# Patient Record
Sex: Female | Born: 1945 | Race: White | Hispanic: No | Marital: Married | Smoking: Never smoker
Health system: Southern US, Community
[De-identification: ages and names within clinical notes are randomized; demographics above are authoritative.]

## PROBLEM LIST (undated history)

## (undated) DIAGNOSIS — E7800 Pure hypercholesterolemia, unspecified: Secondary | ICD-10-CM

## (undated) DIAGNOSIS — N183 Chronic kidney disease, stage 3 unspecified: Secondary | ICD-10-CM

## (undated) DIAGNOSIS — M810 Age-related osteoporosis without current pathological fracture: Secondary | ICD-10-CM

## (undated) DIAGNOSIS — I10 Essential (primary) hypertension: Secondary | ICD-10-CM

## (undated) HISTORY — PX: ABDOMINAL HYSTERECTOMY: SHX81

## (undated) HISTORY — PX: APPENDECTOMY: SHX54

---

## 1997-12-05 ENCOUNTER — Other Ambulatory Visit: Admission: RE | Admit: 1997-12-05 | Discharge: 1997-12-05 | Payer: Self-pay | Admitting: Obstetrics and Gynecology

## 1999-01-15 ENCOUNTER — Encounter: Admission: RE | Admit: 1999-01-15 | Discharge: 1999-01-15 | Payer: Self-pay | Admitting: Unknown Physician Specialty

## 1999-01-15 ENCOUNTER — Other Ambulatory Visit: Admission: RE | Admit: 1999-01-15 | Discharge: 1999-01-15 | Payer: Self-pay | Admitting: *Deleted

## 1999-01-15 ENCOUNTER — Encounter: Payer: Self-pay | Admitting: Family Medicine

## 1999-01-24 ENCOUNTER — Encounter: Admission: RE | Admit: 1999-01-24 | Discharge: 1999-01-24 | Payer: Self-pay | Admitting: Unknown Physician Specialty

## 1999-01-24 ENCOUNTER — Encounter: Payer: Self-pay | Admitting: Family Medicine

## 2014-08-25 DIAGNOSIS — R921 Mammographic calcification found on diagnostic imaging of breast: Secondary | ICD-10-CM | POA: Diagnosis not present

## 2014-08-25 DIAGNOSIS — R928 Other abnormal and inconclusive findings on diagnostic imaging of breast: Secondary | ICD-10-CM | POA: Diagnosis not present

## 2014-10-10 DIAGNOSIS — H524 Presbyopia: Secondary | ICD-10-CM | POA: Diagnosis not present

## 2014-10-10 DIAGNOSIS — H5210 Myopia, unspecified eye: Secondary | ICD-10-CM | POA: Diagnosis not present

## 2014-10-19 DIAGNOSIS — Z0100 Encounter for examination of eyes and vision without abnormal findings: Secondary | ICD-10-CM | POA: Diagnosis not present

## 2014-11-07 DIAGNOSIS — E785 Hyperlipidemia, unspecified: Secondary | ICD-10-CM | POA: Diagnosis not present

## 2014-11-07 DIAGNOSIS — Z9181 History of falling: Secondary | ICD-10-CM | POA: Diagnosis not present

## 2014-11-07 DIAGNOSIS — N644 Mastodynia: Secondary | ICD-10-CM | POA: Diagnosis not present

## 2014-11-07 DIAGNOSIS — B360 Pityriasis versicolor: Secondary | ICD-10-CM | POA: Diagnosis not present

## 2014-11-07 DIAGNOSIS — Z139 Encounter for screening, unspecified: Secondary | ICD-10-CM | POA: Diagnosis not present

## 2014-11-07 DIAGNOSIS — I10 Essential (primary) hypertension: Secondary | ICD-10-CM | POA: Diagnosis not present

## 2014-11-07 DIAGNOSIS — D539 Nutritional anemia, unspecified: Secondary | ICD-10-CM | POA: Diagnosis not present

## 2014-11-07 DIAGNOSIS — R739 Hyperglycemia, unspecified: Secondary | ICD-10-CM | POA: Diagnosis not present

## 2014-11-07 DIAGNOSIS — Z1389 Encounter for screening for other disorder: Secondary | ICD-10-CM | POA: Diagnosis not present

## 2014-12-19 DIAGNOSIS — Z6828 Body mass index (BMI) 28.0-28.9, adult: Secondary | ICD-10-CM | POA: Diagnosis not present

## 2014-12-19 DIAGNOSIS — I10 Essential (primary) hypertension: Secondary | ICD-10-CM | POA: Diagnosis not present

## 2014-12-19 DIAGNOSIS — N644 Mastodynia: Secondary | ICD-10-CM | POA: Diagnosis not present

## 2014-12-19 DIAGNOSIS — E785 Hyperlipidemia, unspecified: Secondary | ICD-10-CM | POA: Diagnosis not present

## 2014-12-19 DIAGNOSIS — B360 Pityriasis versicolor: Secondary | ICD-10-CM | POA: Diagnosis not present

## 2015-02-03 DIAGNOSIS — R1011 Right upper quadrant pain: Secondary | ICD-10-CM | POA: Diagnosis not present

## 2015-02-03 DIAGNOSIS — I10 Essential (primary) hypertension: Secondary | ICD-10-CM | POA: Diagnosis not present

## 2015-02-03 DIAGNOSIS — Z79899 Other long term (current) drug therapy: Secondary | ICD-10-CM | POA: Diagnosis not present

## 2015-02-03 DIAGNOSIS — N390 Urinary tract infection, site not specified: Secondary | ICD-10-CM | POA: Diagnosis not present

## 2015-03-24 DIAGNOSIS — E785 Hyperlipidemia, unspecified: Secondary | ICD-10-CM | POA: Diagnosis not present

## 2015-03-24 DIAGNOSIS — N390 Urinary tract infection, site not specified: Secondary | ICD-10-CM | POA: Diagnosis not present

## 2015-03-24 DIAGNOSIS — Z6829 Body mass index (BMI) 29.0-29.9, adult: Secondary | ICD-10-CM | POA: Diagnosis not present

## 2015-03-24 DIAGNOSIS — I10 Essential (primary) hypertension: Secondary | ICD-10-CM | POA: Diagnosis not present

## 2015-03-24 DIAGNOSIS — D539 Nutritional anemia, unspecified: Secondary | ICD-10-CM | POA: Diagnosis not present

## 2015-03-24 DIAGNOSIS — R739 Hyperglycemia, unspecified: Secondary | ICD-10-CM | POA: Diagnosis not present

## 2015-05-05 DIAGNOSIS — Z6829 Body mass index (BMI) 29.0-29.9, adult: Secondary | ICD-10-CM | POA: Diagnosis not present

## 2015-05-05 DIAGNOSIS — N390 Urinary tract infection, site not specified: Secondary | ICD-10-CM | POA: Diagnosis not present

## 2015-05-05 DIAGNOSIS — I10 Essential (primary) hypertension: Secondary | ICD-10-CM | POA: Diagnosis not present

## 2015-05-25 DIAGNOSIS — Z6829 Body mass index (BMI) 29.0-29.9, adult: Secondary | ICD-10-CM | POA: Diagnosis not present

## 2015-05-25 DIAGNOSIS — J208 Acute bronchitis due to other specified organisms: Secondary | ICD-10-CM | POA: Diagnosis not present

## 2015-05-25 DIAGNOSIS — J309 Allergic rhinitis, unspecified: Secondary | ICD-10-CM | POA: Diagnosis not present

## 2015-06-30 DIAGNOSIS — D539 Nutritional anemia, unspecified: Secondary | ICD-10-CM | POA: Diagnosis not present

## 2015-06-30 DIAGNOSIS — E785 Hyperlipidemia, unspecified: Secondary | ICD-10-CM | POA: Diagnosis not present

## 2015-06-30 DIAGNOSIS — I10 Essential (primary) hypertension: Secondary | ICD-10-CM | POA: Diagnosis not present

## 2015-06-30 DIAGNOSIS — R739 Hyperglycemia, unspecified: Secondary | ICD-10-CM | POA: Diagnosis not present

## 2015-06-30 DIAGNOSIS — N390 Urinary tract infection, site not specified: Secondary | ICD-10-CM | POA: Diagnosis not present

## 2015-06-30 DIAGNOSIS — Z6829 Body mass index (BMI) 29.0-29.9, adult: Secondary | ICD-10-CM | POA: Diagnosis not present

## 2015-06-30 DIAGNOSIS — R109 Unspecified abdominal pain: Secondary | ICD-10-CM | POA: Diagnosis not present

## 2015-06-30 DIAGNOSIS — E559 Vitamin D deficiency, unspecified: Secondary | ICD-10-CM | POA: Diagnosis not present

## 2015-07-10 DIAGNOSIS — N390 Urinary tract infection, site not specified: Secondary | ICD-10-CM | POA: Diagnosis not present

## 2015-07-28 DIAGNOSIS — Z124 Encounter for screening for malignant neoplasm of cervix: Secondary | ICD-10-CM | POA: Diagnosis not present

## 2015-07-28 DIAGNOSIS — Z0000 Encounter for general adult medical examination without abnormal findings: Secondary | ICD-10-CM | POA: Diagnosis not present

## 2015-07-28 DIAGNOSIS — Z6828 Body mass index (BMI) 28.0-28.9, adult: Secondary | ICD-10-CM | POA: Diagnosis not present

## 2015-07-28 DIAGNOSIS — N390 Urinary tract infection, site not specified: Secondary | ICD-10-CM | POA: Diagnosis not present

## 2015-08-18 DIAGNOSIS — R3129 Other microscopic hematuria: Secondary | ICD-10-CM | POA: Diagnosis not present

## 2015-08-18 DIAGNOSIS — R1031 Right lower quadrant pain: Secondary | ICD-10-CM | POA: Diagnosis not present

## 2015-08-18 DIAGNOSIS — N3090 Cystitis, unspecified without hematuria: Secondary | ICD-10-CM | POA: Diagnosis not present

## 2015-08-31 DIAGNOSIS — L559 Sunburn, unspecified: Secondary | ICD-10-CM | POA: Diagnosis not present

## 2015-08-31 DIAGNOSIS — S90821A Blister (nonthermal), right foot, initial encounter: Secondary | ICD-10-CM | POA: Diagnosis not present

## 2015-08-31 DIAGNOSIS — L03115 Cellulitis of right lower limb: Secondary | ICD-10-CM | POA: Diagnosis not present

## 2015-09-01 DIAGNOSIS — R3129 Other microscopic hematuria: Secondary | ICD-10-CM | POA: Diagnosis not present

## 2015-09-01 DIAGNOSIS — N3090 Cystitis, unspecified without hematuria: Secondary | ICD-10-CM | POA: Diagnosis not present

## 2015-09-06 DIAGNOSIS — Z6829 Body mass index (BMI) 29.0-29.9, adult: Secondary | ICD-10-CM | POA: Diagnosis not present

## 2015-09-06 DIAGNOSIS — S91301A Unspecified open wound, right foot, initial encounter: Secondary | ICD-10-CM | POA: Diagnosis not present

## 2015-09-06 DIAGNOSIS — L03115 Cellulitis of right lower limb: Secondary | ICD-10-CM | POA: Diagnosis not present

## 2015-09-18 DIAGNOSIS — R1013 Epigastric pain: Secondary | ICD-10-CM | POA: Diagnosis not present

## 2015-09-18 DIAGNOSIS — R1011 Right upper quadrant pain: Secondary | ICD-10-CM | POA: Diagnosis not present

## 2015-09-18 DIAGNOSIS — Z6829 Body mass index (BMI) 29.0-29.9, adult: Secondary | ICD-10-CM | POA: Diagnosis not present

## 2015-09-25 DIAGNOSIS — R1011 Right upper quadrant pain: Secondary | ICD-10-CM | POA: Diagnosis not present

## 2015-09-25 DIAGNOSIS — R109 Unspecified abdominal pain: Secondary | ICD-10-CM | POA: Diagnosis not present

## 2015-09-25 DIAGNOSIS — K449 Diaphragmatic hernia without obstruction or gangrene: Secondary | ICD-10-CM | POA: Diagnosis not present

## 2015-09-28 DIAGNOSIS — R1011 Right upper quadrant pain: Secondary | ICD-10-CM | POA: Diagnosis not present

## 2015-09-28 DIAGNOSIS — R1013 Epigastric pain: Secondary | ICD-10-CM | POA: Diagnosis not present

## 2015-10-02 DIAGNOSIS — R0789 Other chest pain: Secondary | ICD-10-CM | POA: Diagnosis not present

## 2015-11-14 DIAGNOSIS — R21 Rash and other nonspecific skin eruption: Secondary | ICD-10-CM | POA: Diagnosis not present

## 2015-11-14 DIAGNOSIS — K219 Gastro-esophageal reflux disease without esophagitis: Secondary | ICD-10-CM | POA: Diagnosis not present

## 2015-11-14 DIAGNOSIS — Z9181 History of falling: Secondary | ICD-10-CM | POA: Diagnosis not present

## 2015-11-14 DIAGNOSIS — E785 Hyperlipidemia, unspecified: Secondary | ICD-10-CM | POA: Diagnosis not present

## 2015-11-14 DIAGNOSIS — R739 Hyperglycemia, unspecified: Secondary | ICD-10-CM | POA: Diagnosis not present

## 2015-11-14 DIAGNOSIS — Z1389 Encounter for screening for other disorder: Secondary | ICD-10-CM | POA: Diagnosis not present

## 2015-11-14 DIAGNOSIS — D539 Nutritional anemia, unspecified: Secondary | ICD-10-CM | POA: Diagnosis not present

## 2015-11-14 DIAGNOSIS — R300 Dysuria: Secondary | ICD-10-CM | POA: Diagnosis not present

## 2015-11-14 DIAGNOSIS — N644 Mastodynia: Secondary | ICD-10-CM | POA: Diagnosis not present

## 2015-11-14 DIAGNOSIS — N390 Urinary tract infection, site not specified: Secondary | ICD-10-CM | POA: Diagnosis not present

## 2015-11-23 DIAGNOSIS — L0291 Cutaneous abscess, unspecified: Secondary | ICD-10-CM | POA: Diagnosis not present

## 2015-12-20 DIAGNOSIS — L570 Actinic keratosis: Secondary | ICD-10-CM | POA: Diagnosis not present

## 2015-12-20 DIAGNOSIS — B351 Tinea unguium: Secondary | ICD-10-CM | POA: Diagnosis not present

## 2015-12-20 DIAGNOSIS — L300 Nummular dermatitis: Secondary | ICD-10-CM | POA: Diagnosis not present

## 2015-12-20 DIAGNOSIS — R531 Weakness: Secondary | ICD-10-CM | POA: Diagnosis not present

## 2015-12-25 DIAGNOSIS — Z1231 Encounter for screening mammogram for malignant neoplasm of breast: Secondary | ICD-10-CM | POA: Diagnosis not present

## 2015-12-26 DIAGNOSIS — M8589 Other specified disorders of bone density and structure, multiple sites: Secondary | ICD-10-CM | POA: Diagnosis not present

## 2015-12-26 DIAGNOSIS — M810 Age-related osteoporosis without current pathological fracture: Secondary | ICD-10-CM | POA: Diagnosis not present

## 2016-02-22 DIAGNOSIS — E559 Vitamin D deficiency, unspecified: Secondary | ICD-10-CM | POA: Diagnosis not present

## 2016-02-22 DIAGNOSIS — M810 Age-related osteoporosis without current pathological fracture: Secondary | ICD-10-CM | POA: Diagnosis not present

## 2016-02-22 DIAGNOSIS — E785 Hyperlipidemia, unspecified: Secondary | ICD-10-CM | POA: Diagnosis not present

## 2016-02-22 DIAGNOSIS — D539 Nutritional anemia, unspecified: Secondary | ICD-10-CM | POA: Diagnosis not present

## 2016-02-22 DIAGNOSIS — R739 Hyperglycemia, unspecified: Secondary | ICD-10-CM | POA: Diagnosis not present

## 2016-02-22 DIAGNOSIS — Z2821 Immunization not carried out because of patient refusal: Secondary | ICD-10-CM | POA: Diagnosis not present

## 2016-02-22 DIAGNOSIS — M545 Low back pain: Secondary | ICD-10-CM | POA: Diagnosis not present

## 2016-02-22 DIAGNOSIS — I10 Essential (primary) hypertension: Secondary | ICD-10-CM | POA: Diagnosis not present

## 2016-03-30 DIAGNOSIS — N3000 Acute cystitis without hematuria: Secondary | ICD-10-CM | POA: Diagnosis not present

## 2016-03-30 DIAGNOSIS — R300 Dysuria: Secondary | ICD-10-CM | POA: Diagnosis not present

## 2016-03-30 DIAGNOSIS — B009 Herpesviral infection, unspecified: Secondary | ICD-10-CM | POA: Diagnosis not present

## 2016-03-30 DIAGNOSIS — J019 Acute sinusitis, unspecified: Secondary | ICD-10-CM | POA: Diagnosis not present

## 2016-04-10 DIAGNOSIS — N390 Urinary tract infection, site not specified: Secondary | ICD-10-CM | POA: Diagnosis not present

## 2016-04-10 DIAGNOSIS — R5381 Other malaise: Secondary | ICD-10-CM | POA: Diagnosis not present

## 2016-05-30 DIAGNOSIS — K219 Gastro-esophageal reflux disease without esophagitis: Secondary | ICD-10-CM | POA: Diagnosis not present

## 2016-05-30 DIAGNOSIS — D539 Nutritional anemia, unspecified: Secondary | ICD-10-CM | POA: Diagnosis not present

## 2016-05-30 DIAGNOSIS — R739 Hyperglycemia, unspecified: Secondary | ICD-10-CM | POA: Diagnosis not present

## 2016-05-30 DIAGNOSIS — I10 Essential (primary) hypertension: Secondary | ICD-10-CM | POA: Diagnosis not present

## 2016-05-30 DIAGNOSIS — E785 Hyperlipidemia, unspecified: Secondary | ICD-10-CM | POA: Diagnosis not present

## 2016-09-12 DIAGNOSIS — Z0000 Encounter for general adult medical examination without abnormal findings: Secondary | ICD-10-CM | POA: Diagnosis not present

## 2016-09-12 DIAGNOSIS — E785 Hyperlipidemia, unspecified: Secondary | ICD-10-CM | POA: Diagnosis not present

## 2016-09-12 DIAGNOSIS — Z1389 Encounter for screening for other disorder: Secondary | ICD-10-CM | POA: Diagnosis not present

## 2016-09-12 DIAGNOSIS — Z139 Encounter for screening, unspecified: Secondary | ICD-10-CM | POA: Diagnosis not present

## 2016-09-12 DIAGNOSIS — Z1231 Encounter for screening mammogram for malignant neoplasm of breast: Secondary | ICD-10-CM | POA: Diagnosis not present

## 2016-09-12 DIAGNOSIS — Z1211 Encounter for screening for malignant neoplasm of colon: Secondary | ICD-10-CM | POA: Diagnosis not present

## 2016-09-12 DIAGNOSIS — Z9181 History of falling: Secondary | ICD-10-CM | POA: Diagnosis not present

## 2016-09-12 DIAGNOSIS — Z136 Encounter for screening for cardiovascular disorders: Secondary | ICD-10-CM | POA: Diagnosis not present

## 2016-10-01 DIAGNOSIS — K219 Gastro-esophageal reflux disease without esophagitis: Secondary | ICD-10-CM | POA: Diagnosis not present

## 2016-10-01 DIAGNOSIS — R739 Hyperglycemia, unspecified: Secondary | ICD-10-CM | POA: Diagnosis not present

## 2016-10-01 DIAGNOSIS — M810 Age-related osteoporosis without current pathological fracture: Secondary | ICD-10-CM | POA: Diagnosis not present

## 2016-10-01 DIAGNOSIS — I10 Essential (primary) hypertension: Secondary | ICD-10-CM | POA: Diagnosis not present

## 2016-10-01 DIAGNOSIS — D539 Nutritional anemia, unspecified: Secondary | ICD-10-CM | POA: Diagnosis not present

## 2016-10-01 DIAGNOSIS — E785 Hyperlipidemia, unspecified: Secondary | ICD-10-CM | POA: Diagnosis not present

## 2016-10-07 DIAGNOSIS — Z01818 Encounter for other preprocedural examination: Secondary | ICD-10-CM | POA: Diagnosis not present

## 2016-10-07 DIAGNOSIS — Z86010 Personal history of colonic polyps: Secondary | ICD-10-CM | POA: Diagnosis not present

## 2016-10-07 DIAGNOSIS — Z800 Family history of malignant neoplasm of digestive organs: Secondary | ICD-10-CM | POA: Diagnosis not present

## 2016-10-21 DIAGNOSIS — N390 Urinary tract infection, site not specified: Secondary | ICD-10-CM | POA: Diagnosis not present

## 2016-10-24 DIAGNOSIS — Z79899 Other long term (current) drug therapy: Secondary | ICD-10-CM | POA: Diagnosis not present

## 2016-10-24 DIAGNOSIS — Z90710 Acquired absence of both cervix and uterus: Secondary | ICD-10-CM | POA: Diagnosis not present

## 2016-10-24 DIAGNOSIS — Z85828 Personal history of other malignant neoplasm of skin: Secondary | ICD-10-CM | POA: Diagnosis not present

## 2016-10-24 DIAGNOSIS — Z86010 Personal history of colonic polyps: Secondary | ICD-10-CM | POA: Diagnosis not present

## 2016-10-24 DIAGNOSIS — Z1211 Encounter for screening for malignant neoplasm of colon: Secondary | ICD-10-CM | POA: Diagnosis not present

## 2016-10-24 DIAGNOSIS — K5730 Diverticulosis of large intestine without perforation or abscess without bleeding: Secondary | ICD-10-CM | POA: Diagnosis not present

## 2016-10-24 DIAGNOSIS — I10 Essential (primary) hypertension: Secondary | ICD-10-CM | POA: Diagnosis not present

## 2016-10-24 DIAGNOSIS — K644 Residual hemorrhoidal skin tags: Secondary | ICD-10-CM | POA: Diagnosis not present

## 2016-10-24 DIAGNOSIS — Z800 Family history of malignant neoplasm of digestive organs: Secondary | ICD-10-CM | POA: Diagnosis not present

## 2016-11-06 DIAGNOSIS — Z6829 Body mass index (BMI) 29.0-29.9, adult: Secondary | ICD-10-CM | POA: Diagnosis not present

## 2016-11-06 DIAGNOSIS — N390 Urinary tract infection, site not specified: Secondary | ICD-10-CM | POA: Diagnosis not present

## 2016-11-06 DIAGNOSIS — M25561 Pain in right knee: Secondary | ICD-10-CM | POA: Diagnosis not present

## 2016-11-14 DIAGNOSIS — N390 Urinary tract infection, site not specified: Secondary | ICD-10-CM | POA: Diagnosis not present

## 2016-11-15 DIAGNOSIS — M1711 Unilateral primary osteoarthritis, right knee: Secondary | ICD-10-CM | POA: Diagnosis not present

## 2016-12-10 DIAGNOSIS — H52223 Regular astigmatism, bilateral: Secondary | ICD-10-CM | POA: Diagnosis not present

## 2016-12-24 DIAGNOSIS — H02839 Dermatochalasis of unspecified eye, unspecified eyelid: Secondary | ICD-10-CM | POA: Diagnosis not present

## 2016-12-24 DIAGNOSIS — H26491 Other secondary cataract, right eye: Secondary | ICD-10-CM | POA: Diagnosis not present

## 2016-12-24 DIAGNOSIS — I10 Essential (primary) hypertension: Secondary | ICD-10-CM | POA: Diagnosis not present

## 2016-12-24 DIAGNOSIS — Z961 Presence of intraocular lens: Secondary | ICD-10-CM | POA: Diagnosis not present

## 2017-02-07 DIAGNOSIS — R300 Dysuria: Secondary | ICD-10-CM | POA: Diagnosis not present

## 2017-02-07 DIAGNOSIS — I10 Essential (primary) hypertension: Secondary | ICD-10-CM | POA: Diagnosis not present

## 2017-02-07 DIAGNOSIS — Z1231 Encounter for screening mammogram for malignant neoplasm of breast: Secondary | ICD-10-CM | POA: Diagnosis not present

## 2017-02-07 DIAGNOSIS — Z1331 Encounter for screening for depression: Secondary | ICD-10-CM | POA: Diagnosis not present

## 2017-02-07 DIAGNOSIS — D539 Nutritional anemia, unspecified: Secondary | ICD-10-CM | POA: Diagnosis not present

## 2017-02-07 DIAGNOSIS — E785 Hyperlipidemia, unspecified: Secondary | ICD-10-CM | POA: Diagnosis not present

## 2017-02-07 DIAGNOSIS — J309 Allergic rhinitis, unspecified: Secondary | ICD-10-CM | POA: Diagnosis not present

## 2017-02-07 DIAGNOSIS — R252 Cramp and spasm: Secondary | ICD-10-CM | POA: Diagnosis not present

## 2017-07-27 DIAGNOSIS — S8392XA Sprain of unspecified site of left knee, initial encounter: Secondary | ICD-10-CM | POA: Diagnosis not present

## 2017-10-23 DIAGNOSIS — Z6828 Body mass index (BMI) 28.0-28.9, adult: Secondary | ICD-10-CM | POA: Diagnosis not present

## 2017-10-23 DIAGNOSIS — N390 Urinary tract infection, site not specified: Secondary | ICD-10-CM | POA: Diagnosis not present

## 2017-10-23 DIAGNOSIS — R350 Frequency of micturition: Secondary | ICD-10-CM | POA: Diagnosis not present

## 2017-10-23 DIAGNOSIS — M25562 Pain in left knee: Secondary | ICD-10-CM | POA: Diagnosis not present

## 2017-10-23 DIAGNOSIS — R21 Rash and other nonspecific skin eruption: Secondary | ICD-10-CM | POA: Diagnosis not present

## 2017-10-30 DIAGNOSIS — M1712 Unilateral primary osteoarthritis, left knee: Secondary | ICD-10-CM | POA: Diagnosis not present

## 2018-01-01 DIAGNOSIS — E785 Hyperlipidemia, unspecified: Secondary | ICD-10-CM | POA: Diagnosis not present

## 2018-01-01 DIAGNOSIS — Z9181 History of falling: Secondary | ICD-10-CM | POA: Diagnosis not present

## 2018-01-01 DIAGNOSIS — Z139 Encounter for screening, unspecified: Secondary | ICD-10-CM | POA: Diagnosis not present

## 2018-01-01 DIAGNOSIS — N390 Urinary tract infection, site not specified: Secondary | ICD-10-CM | POA: Diagnosis not present

## 2018-01-01 DIAGNOSIS — Z1339 Encounter for screening examination for other mental health and behavioral disorders: Secondary | ICD-10-CM | POA: Diagnosis not present

## 2018-01-01 DIAGNOSIS — R350 Frequency of micturition: Secondary | ICD-10-CM | POA: Diagnosis not present

## 2018-01-01 DIAGNOSIS — R5381 Other malaise: Secondary | ICD-10-CM | POA: Diagnosis not present

## 2018-01-01 DIAGNOSIS — J069 Acute upper respiratory infection, unspecified: Secondary | ICD-10-CM | POA: Diagnosis not present

## 2018-01-01 DIAGNOSIS — N644 Mastodynia: Secondary | ICD-10-CM | POA: Diagnosis not present

## 2018-01-01 DIAGNOSIS — E663 Overweight: Secondary | ICD-10-CM | POA: Diagnosis not present

## 2018-01-20 DIAGNOSIS — D631 Anemia in chronic kidney disease: Secondary | ICD-10-CM | POA: Diagnosis not present

## 2018-01-20 DIAGNOSIS — N189 Chronic kidney disease, unspecified: Secondary | ICD-10-CM | POA: Diagnosis not present

## 2018-01-20 DIAGNOSIS — R946 Abnormal results of thyroid function studies: Secondary | ICD-10-CM

## 2018-01-20 DIAGNOSIS — N289 Disorder of kidney and ureter, unspecified: Secondary | ICD-10-CM

## 2018-01-20 DIAGNOSIS — D6489 Other specified anemias: Secondary | ICD-10-CM

## 2018-01-27 DIAGNOSIS — R946 Abnormal results of thyroid function studies: Secondary | ICD-10-CM | POA: Diagnosis not present

## 2018-01-27 DIAGNOSIS — D631 Anemia in chronic kidney disease: Secondary | ICD-10-CM | POA: Diagnosis not present

## 2018-01-27 DIAGNOSIS — N189 Chronic kidney disease, unspecified: Secondary | ICD-10-CM | POA: Diagnosis not present

## 2018-01-28 DIAGNOSIS — Z1231 Encounter for screening mammogram for malignant neoplasm of breast: Secondary | ICD-10-CM | POA: Diagnosis not present

## 2018-02-02 DIAGNOSIS — H52223 Regular astigmatism, bilateral: Secondary | ICD-10-CM | POA: Diagnosis not present

## 2018-02-02 DIAGNOSIS — H524 Presbyopia: Secondary | ICD-10-CM | POA: Diagnosis not present

## 2018-02-12 DIAGNOSIS — N289 Disorder of kidney and ureter, unspecified: Secondary | ICD-10-CM | POA: Diagnosis not present

## 2018-02-12 DIAGNOSIS — D539 Nutritional anemia, unspecified: Secondary | ICD-10-CM | POA: Diagnosis not present

## 2018-02-12 DIAGNOSIS — R5381 Other malaise: Secondary | ICD-10-CM | POA: Diagnosis not present

## 2018-02-12 DIAGNOSIS — Z6827 Body mass index (BMI) 27.0-27.9, adult: Secondary | ICD-10-CM | POA: Diagnosis not present

## 2018-02-12 DIAGNOSIS — M545 Low back pain: Secondary | ICD-10-CM | POA: Diagnosis not present

## 2018-03-24 DIAGNOSIS — N183 Chronic kidney disease, stage 3 (moderate): Secondary | ICD-10-CM | POA: Diagnosis not present

## 2018-03-24 DIAGNOSIS — D631 Anemia in chronic kidney disease: Secondary | ICD-10-CM | POA: Diagnosis not present

## 2018-03-24 DIAGNOSIS — I129 Hypertensive chronic kidney disease with stage 1 through stage 4 chronic kidney disease, or unspecified chronic kidney disease: Secondary | ICD-10-CM | POA: Diagnosis not present

## 2018-03-24 DIAGNOSIS — N390 Urinary tract infection, site not specified: Secondary | ICD-10-CM | POA: Diagnosis not present

## 2018-04-02 ENCOUNTER — Other Ambulatory Visit: Payer: Self-pay | Admitting: Nephrology

## 2018-04-02 DIAGNOSIS — N183 Chronic kidney disease, stage 3 unspecified: Secondary | ICD-10-CM

## 2018-04-06 DIAGNOSIS — D631 Anemia in chronic kidney disease: Secondary | ICD-10-CM | POA: Diagnosis not present

## 2018-04-06 DIAGNOSIS — N189 Chronic kidney disease, unspecified: Secondary | ICD-10-CM | POA: Diagnosis not present

## 2018-04-07 ENCOUNTER — Ambulatory Visit
Admission: RE | Admit: 2018-04-07 | Discharge: 2018-04-07 | Disposition: A | Discharge status: Home / Self Care | Payer: Medicare HMO | Source: Ambulatory Visit | Attending: Nephrology | Admitting: Nephrology

## 2018-04-07 DIAGNOSIS — N183 Chronic kidney disease, stage 3 unspecified: Secondary | ICD-10-CM

## 2018-04-09 DIAGNOSIS — N390 Urinary tract infection, site not specified: Secondary | ICD-10-CM | POA: Diagnosis not present

## 2018-04-13 DIAGNOSIS — D631 Anemia in chronic kidney disease: Secondary | ICD-10-CM | POA: Diagnosis not present

## 2018-04-13 DIAGNOSIS — N189 Chronic kidney disease, unspecified: Secondary | ICD-10-CM | POA: Diagnosis not present

## 2018-05-22 DIAGNOSIS — N390 Urinary tract infection, site not specified: Secondary | ICD-10-CM | POA: Diagnosis not present

## 2018-05-22 DIAGNOSIS — M542 Cervicalgia: Secondary | ICD-10-CM | POA: Diagnosis not present

## 2018-05-22 DIAGNOSIS — Z6827 Body mass index (BMI) 27.0-27.9, adult: Secondary | ICD-10-CM | POA: Diagnosis not present

## 2018-05-22 DIAGNOSIS — N289 Disorder of kidney and ureter, unspecified: Secondary | ICD-10-CM | POA: Diagnosis not present

## 2018-05-22 DIAGNOSIS — R350 Frequency of micturition: Secondary | ICD-10-CM | POA: Diagnosis not present

## 2018-05-22 DIAGNOSIS — D539 Nutritional anemia, unspecified: Secondary | ICD-10-CM | POA: Diagnosis not present

## 2018-05-22 DIAGNOSIS — G4700 Insomnia, unspecified: Secondary | ICD-10-CM | POA: Diagnosis not present

## 2018-05-28 DIAGNOSIS — N183 Chronic kidney disease, stage 3 (moderate): Secondary | ICD-10-CM | POA: Diagnosis not present

## 2018-05-28 DIAGNOSIS — N390 Urinary tract infection, site not specified: Secondary | ICD-10-CM | POA: Diagnosis not present

## 2018-05-28 DIAGNOSIS — I129 Hypertensive chronic kidney disease with stage 1 through stage 4 chronic kidney disease, or unspecified chronic kidney disease: Secondary | ICD-10-CM | POA: Diagnosis not present

## 2018-05-28 DIAGNOSIS — D631 Anemia in chronic kidney disease: Secondary | ICD-10-CM | POA: Diagnosis not present

## 2018-06-15 DIAGNOSIS — Z6827 Body mass index (BMI) 27.0-27.9, adult: Secondary | ICD-10-CM | POA: Diagnosis not present

## 2018-06-15 DIAGNOSIS — Z1331 Encounter for screening for depression: Secondary | ICD-10-CM | POA: Diagnosis not present

## 2018-06-15 DIAGNOSIS — B029 Zoster without complications: Secondary | ICD-10-CM | POA: Diagnosis not present

## 2018-06-26 DIAGNOSIS — S62307A Unspecified fracture of fifth metacarpal bone, left hand, initial encounter for closed fracture: Secondary | ICD-10-CM | POA: Diagnosis not present

## 2018-07-24 DIAGNOSIS — S62307A Unspecified fracture of fifth metacarpal bone, left hand, initial encounter for closed fracture: Secondary | ICD-10-CM | POA: Diagnosis not present

## 2018-09-25 DIAGNOSIS — L0390 Cellulitis, unspecified: Secondary | ICD-10-CM | POA: Diagnosis not present

## 2018-10-09 DIAGNOSIS — E663 Overweight: Secondary | ICD-10-CM | POA: Diagnosis not present

## 2018-10-09 DIAGNOSIS — R21 Rash and other nonspecific skin eruption: Secondary | ICD-10-CM | POA: Diagnosis not present

## 2018-10-09 DIAGNOSIS — R350 Frequency of micturition: Secondary | ICD-10-CM | POA: Diagnosis not present

## 2018-10-09 DIAGNOSIS — I10 Essential (primary) hypertension: Secondary | ICD-10-CM | POA: Diagnosis not present

## 2018-10-09 DIAGNOSIS — E785 Hyperlipidemia, unspecified: Secondary | ICD-10-CM | POA: Diagnosis not present

## 2018-10-09 DIAGNOSIS — D539 Nutritional anemia, unspecified: Secondary | ICD-10-CM | POA: Diagnosis not present

## 2018-10-09 DIAGNOSIS — Z6828 Body mass index (BMI) 28.0-28.9, adult: Secondary | ICD-10-CM | POA: Diagnosis not present

## 2018-10-10 ENCOUNTER — Other Ambulatory Visit: Payer: Self-pay

## 2018-10-10 ENCOUNTER — Emergency Department (HOSPITAL_BASED_OUTPATIENT_CLINIC_OR_DEPARTMENT_OTHER)
Admission: EM | Admit: 2018-10-10 | Discharge: 2018-10-10 | Disposition: A | Discharge status: Home / Self Care | Payer: Medicare HMO | Attending: Emergency Medicine | Admitting: Emergency Medicine

## 2018-10-10 ENCOUNTER — Encounter (HOSPITAL_BASED_OUTPATIENT_CLINIC_OR_DEPARTMENT_OTHER): Payer: Self-pay | Admitting: Emergency Medicine

## 2018-10-10 DIAGNOSIS — Z79899 Other long term (current) drug therapy: Secondary | ICD-10-CM | POA: Diagnosis not present

## 2018-10-10 DIAGNOSIS — N184 Chronic kidney disease, stage 4 (severe): Secondary | ICD-10-CM | POA: Insufficient documentation

## 2018-10-10 DIAGNOSIS — N189 Chronic kidney disease, unspecified: Secondary | ICD-10-CM

## 2018-10-10 DIAGNOSIS — E875 Hyperkalemia: Secondary | ICD-10-CM

## 2018-10-10 DIAGNOSIS — E782 Mixed hyperlipidemia: Secondary | ICD-10-CM | POA: Insufficient documentation

## 2018-10-10 DIAGNOSIS — I129 Hypertensive chronic kidney disease with stage 1 through stage 4 chronic kidney disease, or unspecified chronic kidney disease: Secondary | ICD-10-CM | POA: Insufficient documentation

## 2018-10-10 HISTORY — DX: Pure hypercholesterolemia, unspecified: E78.00

## 2018-10-10 HISTORY — DX: Essential (primary) hypertension: I10

## 2018-10-10 HISTORY — DX: Age-related osteoporosis without current pathological fracture: M81.0

## 2018-10-10 LAB — CBC
HCT: 30.4 % — ABNORMAL LOW (ref 36.0–46.0)
Hemoglobin: 9.7 g/dL — ABNORMAL LOW (ref 12.0–15.0)
MCH: 31.1 pg (ref 26.0–34.0)
MCHC: 31.9 g/dL (ref 30.0–36.0)
MCV: 97.4 fL (ref 80.0–100.0)
Platelets: 301 10*3/uL (ref 150–400)
RBC: 3.12 MIL/uL — ABNORMAL LOW (ref 3.87–5.11)
RDW: 11.2 % — ABNORMAL LOW (ref 11.5–15.5)
WBC: 6.3 10*3/uL (ref 4.0–10.5)
nRBC: 0 % (ref 0.0–0.2)

## 2018-10-10 LAB — BASIC METABOLIC PANEL
Anion gap: 7 (ref 5–15)
BUN: 33 mg/dL — ABNORMAL HIGH (ref 8–23)
CO2: 18 mmol/L — ABNORMAL LOW (ref 22–32)
Calcium: 9.3 mg/dL (ref 8.9–10.3)
Chloride: 111 mmol/L (ref 98–111)
Creatinine, Ser: 2.13 mg/dL — ABNORMAL HIGH (ref 0.44–1.00)
GFR calc Af Amer: 26 mL/min — ABNORMAL LOW (ref >60)
GFR calc non Af Amer: 22 mL/min — ABNORMAL LOW (ref >60)
Glucose, Bld: 101 mg/dL — ABNORMAL HIGH (ref 70–99)
Potassium: 6.1 mmol/L — ABNORMAL HIGH (ref 3.5–5.1)
Sodium: 136 mmol/L (ref 135–145)

## 2018-10-10 MED ORDER — LOKELMA 10 G PO PACK: 10.0000 g | PACK | Freq: Every day | ORAL | 0 refills | Status: AC

## 2018-10-10 MED ORDER — SODIUM CHLORIDE 0.9 % IV SOLN: 1000.0000 mL | INTRAVENOUS | Status: DC

## 2018-10-10 MED ORDER — SODIUM CHLORIDE 0.9% FLUSH
3.0000 mL | Freq: Once | INTRAVENOUS | Status: DC
  Filled 2018-10-10: qty 3

## 2018-10-10 MED ORDER — SODIUM CHLORIDE 0.9 % IV BOLUS (SEPSIS)
500.0000 mL | Freq: Once | INTRAVENOUS | Status: AC
  Administered 2018-10-10: 14:00:00 500 mL via INTRAVENOUS

## 2018-10-10 NOTE — Discharge Instructions (Signed)
Stop taking the lisinopril hydrochlorothiazide medication.  Start taking the [facility] to help with your potassium level over the next week.  Contact your kidney doctors office on Monday to arrange follow-up laboratory testing and an appointment.

## 2018-10-10 NOTE — ED Triage Notes (Signed)
Patient states that she has had a rash and fatigue for the last week - she went to her PMD yesterday and was told to come back here because her potassium was elevated

## 2018-10-10 NOTE — ED Provider Notes (Signed)
[facility] EMERGENCY DEPARTMENT Provider Note   CSN: [id number] Arrival date & time: 10/10/18  1214    History   Chief Complaint Chief Complaint  Patient presents with  . Abnormal Lab    elevated K    HPI Samantha Owen is a 73 y.o. female.     HPI Pt has a history of chronic kidney disease.   Back in May she was anemic and had to have a blood transfusion.  She was also told that her epo levels are low.  Patient has not been able to follow-up for any lab tests since that time.  She saw her doctor this past week.  She had lab tests drawn and was told that her kidney tests were abnormal and she needed to come to the ED for IV fluids and further testing..  Patient denies any trouble with vomiting or diarrhea.  She has felt somewhat fatigued recently.  She is also had a rash that she has been itching.  She was out in the heat recently mowing the lawn and she thinks it is possible she could have become dehydrated from that. Past Medical History:  Diagnosis Date  . Hypercholesteremia   . Hypertension   . Osteoporosis     There are no active problems to display for this patient.   Past Surgical History:  Procedure Laterality Date  . ABDOMINAL HYSTERECTOMY    . APPENDECTOMY       OB History   No obstetric history on file.      Home Medications    Prior to Admission medications   Medication Sig Start Date End Date Taking? Authorizing Provider  alendronate (FOSAMAX) 70 MG tablet Take 70 mg by mouth once a week. Take with a full glass of water on an empty stomach.   Yes [provider]  atorvastatin (LIPITOR) 20 MG tablet Take 20 mg by mouth daily.   Yes [provider]  gabapentin (NEURONTIN) 100 MG capsule Take 100 mg by mouth 3 (three) times daily.   Yes [provider]  omeprazole (PRILOSEC) 20 MG capsule Take 20 mg by mouth daily.   Yes [provider]  lisinopril-hydrochlorothiazide (ZESTORETIC) 10-12.5 MG tablet Take 1  tablet by mouth daily.  10/10/18 Yes [provider]  sodium zirconium cyclosilicate (LOKELMA) 10 g PACK packet Take 10 g by mouth daily for 7 days. 10/10/18 10/17/18  Dorie Rank, MD    Family History History reviewed. No pertinent family history.  Social History Social History   Tobacco Use  . Smoking status: Never Smoker  . Smokeless tobacco: Never Used  Substance Use Topics  . Alcohol use: Never    Frequency: Never  . Drug use: Never     Allergies   Patient has no known allergies.   Review of Systems Review of Systems  Constitutional: Negative for fever.  Skin:       Skin itching  All other systems reviewed and are negative.    Physical Exam Updated Vital Signs BP (!) 144/56   Pulse (!) 55   Temp 98.5 F (36.9 C) (Oral)   Resp 14   Ht 1.575 m (5\' 2" )   Wt 68.5 kg   SpO2 100%   BMI 27.62 kg/m   Physical Exam Vitals signs and nursing note reviewed.  Constitutional:      General: She is not in acute distress.    Appearance: She is well-developed.  HENT:     Head: Normocephalic and atraumatic.  Right Ear: External ear normal.     Left Ear: External ear normal.  Eyes:     General: No scleral icterus.       Right eye: No discharge.        Left eye: No discharge.     Conjunctiva/sclera: Conjunctivae normal.  Neck:     Musculoskeletal: Neck supple.     Trachea: No tracheal deviation.  Cardiovascular:     Rate and Rhythm: Normal rate and regular rhythm.  Pulmonary:     Effort: Pulmonary effort is normal. No respiratory distress.     Breath sounds: Normal breath sounds. No stridor. No wheezing or rales.  Abdominal:     General: Bowel sounds are normal. There is no distension.     Palpations: Abdomen is soft.     Tenderness: There is no abdominal tenderness. There is no guarding or rebound.  Musculoskeletal:        General: No tenderness.  Skin:    General: Skin is warm and dry.     Comments: Few small areas of actinic keratoses and areas  of excoriation of the skin, no vesicular pustular lesions,   Neurological:     Mental Status: She is alert.     Cranial Nerves: No cranial nerve deficit (no facial droop, extraocular movements intact, no slurred speech).     Sensory: No sensory deficit.     Motor: No abnormal muscle tone or seizure activity.     Coordination: Coordination normal.      ED Treatments / Results  Labs (all labs ordered are listed, but only abnormal results are displayed) Labs Reviewed  BASIC METABOLIC PANEL - Abnormal; Notable for the following components:      Result Value   Potassium 6.1 (*)    CO2 18 (*)    Glucose, Bld 101 (*)    BUN 33 (*)    Creatinine, Ser 2.13 (*)    GFR calc non Af Amer 22 (*)    GFR calc Af Amer 26 (*)    All other components within normal limits  CBC - Abnormal; Notable for the following components:   RBC 3.12 (*)    Hemoglobin 9.7 (*)    HCT 30.4 (*)    RDW 11.2 (*)    All other components within normal limits    EKG EKG Interpretation  Date/Time:  Saturday October 10 2018 12:55:19 EDT Ventricular Rate:  52 PR Interval:    QRS Duration: 111 QT Interval:  431 QTC Calculation: 401 R Axis:   22 Text Interpretation:  Sinus rhythm No old tracing to compare Confirmed by Dorie Rank [phone]) on 10/10/2018 12:56:48 PM   Radiology No results found.  Procedures Procedures (including critical care time)  Medications Ordered in ED Medications  sodium chloride 0.9 % bolus 500 mL (500 mLs Intravenous New Bag/Given 10/10/18 1404)    Followed by  0.9 %  sodium chloride infusion (has no administration in time range)     Initial Impression / Assessment and Plan / ED Course  I have reviewed the triage vital signs and the nursing notes.  Pertinent labs & imaging results that were available during my care of the patient were reviewed by me and considered in my medical decision making (see chart for details).   Patient's laboratory tests are notable for decreased bicarb,  elevated potassium and increased BUN and creatinine.  Patient does have a history of chronic kidney disease.  According the medical record she saw Dr. Moshe Cipro back  in April of this year.  I do not have any old laboratory tests for comparison I do suspect this is somewhat of an acute on chronic issue.  Hypokalemia is clearly new but she is not having any EKG changes.  Patient denies any vomiting or diarrhea.  I discussed the case with Dr. Grayland Ormond who is on-call for nephrology.  Plan on discharging home on [place].  I will have her stop the lisinopril hydrochlorothiazide.  Outpatient follow-up with nephrology to have her potassium level rechecked.  Final Clinical Impressions(s) / ED Diagnoses   Final diagnoses:  Hyperkalemia  Chronic kidney disease, unspecified CKD stage    ED Discharge Orders         Ordered    sodium zirconium cyclosilicate (LOKELMA) 10 g PACK packet  Daily     10/10/18 1508           Dorie Rank, MD 10/10/18 1511

## 2018-10-14 DIAGNOSIS — I129 Hypertensive chronic kidney disease with stage 1 through stage 4 chronic kidney disease, or unspecified chronic kidney disease: Secondary | ICD-10-CM | POA: Diagnosis not present

## 2018-10-14 DIAGNOSIS — E875 Hyperkalemia: Secondary | ICD-10-CM | POA: Diagnosis not present

## 2018-10-14 DIAGNOSIS — D631 Anemia in chronic kidney disease: Secondary | ICD-10-CM | POA: Diagnosis not present

## 2018-10-14 DIAGNOSIS — N390 Urinary tract infection, site not specified: Secondary | ICD-10-CM | POA: Diagnosis not present

## 2018-10-14 DIAGNOSIS — N183 Chronic kidney disease, stage 3 (moderate): Secondary | ICD-10-CM | POA: Diagnosis not present

## 2018-10-20 DIAGNOSIS — L578 Other skin changes due to chronic exposure to nonionizing radiation: Secondary | ICD-10-CM | POA: Diagnosis not present

## 2018-10-20 DIAGNOSIS — L299 Pruritus, unspecified: Secondary | ICD-10-CM | POA: Diagnosis not present

## 2018-10-20 DIAGNOSIS — L570 Actinic keratosis: Secondary | ICD-10-CM | POA: Diagnosis not present

## 2018-10-20 DIAGNOSIS — L821 Other seborrheic keratosis: Secondary | ICD-10-CM | POA: Diagnosis not present

## 2018-10-20 DIAGNOSIS — C44622 Squamous cell carcinoma of skin of right upper limb, including shoulder: Secondary | ICD-10-CM | POA: Diagnosis not present

## 2018-10-29 DIAGNOSIS — N183 Chronic kidney disease, stage 3 (moderate): Secondary | ICD-10-CM | POA: Diagnosis not present

## 2018-11-12 DIAGNOSIS — R350 Frequency of micturition: Secondary | ICD-10-CM | POA: Diagnosis not present

## 2018-11-12 DIAGNOSIS — K5909 Other constipation: Secondary | ICD-10-CM | POA: Diagnosis not present

## 2018-11-12 DIAGNOSIS — R5383 Other fatigue: Secondary | ICD-10-CM | POA: Diagnosis not present

## 2018-11-12 DIAGNOSIS — Z6826 Body mass index (BMI) 26.0-26.9, adult: Secondary | ICD-10-CM | POA: Diagnosis not present

## 2018-11-18 DIAGNOSIS — Z1331 Encounter for screening for depression: Secondary | ICD-10-CM | POA: Diagnosis not present

## 2018-11-18 DIAGNOSIS — Z136 Encounter for screening for cardiovascular disorders: Secondary | ICD-10-CM | POA: Diagnosis not present

## 2018-11-18 DIAGNOSIS — Z9181 History of falling: Secondary | ICD-10-CM | POA: Diagnosis not present

## 2018-11-18 DIAGNOSIS — Z1231 Encounter for screening mammogram for malignant neoplasm of breast: Secondary | ICD-10-CM | POA: Diagnosis not present

## 2018-11-18 DIAGNOSIS — Z0000 Encounter for general adult medical examination without abnormal findings: Secondary | ICD-10-CM | POA: Diagnosis not present

## 2018-11-18 DIAGNOSIS — N959 Unspecified menopausal and perimenopausal disorder: Secondary | ICD-10-CM | POA: Diagnosis not present

## 2018-11-18 DIAGNOSIS — E785 Hyperlipidemia, unspecified: Secondary | ICD-10-CM | POA: Diagnosis not present

## 2018-12-03 DIAGNOSIS — M810 Age-related osteoporosis without current pathological fracture: Secondary | ICD-10-CM | POA: Diagnosis not present

## 2018-12-03 DIAGNOSIS — N289 Disorder of kidney and ureter, unspecified: Secondary | ICD-10-CM | POA: Diagnosis not present

## 2018-12-03 DIAGNOSIS — E559 Vitamin D deficiency, unspecified: Secondary | ICD-10-CM | POA: Diagnosis not present

## 2018-12-03 DIAGNOSIS — Z6826 Body mass index (BMI) 26.0-26.9, adult: Secondary | ICD-10-CM | POA: Diagnosis not present

## 2018-12-03 DIAGNOSIS — D539 Nutritional anemia, unspecified: Secondary | ICD-10-CM | POA: Diagnosis not present

## 2018-12-03 DIAGNOSIS — I10 Essential (primary) hypertension: Secondary | ICD-10-CM | POA: Diagnosis not present

## 2018-12-15 DIAGNOSIS — L570 Actinic keratosis: Secondary | ICD-10-CM | POA: Diagnosis not present

## 2018-12-15 DIAGNOSIS — B351 Tinea unguium: Secondary | ICD-10-CM | POA: Diagnosis not present

## 2018-12-15 DIAGNOSIS — R233 Spontaneous ecchymoses: Secondary | ICD-10-CM | POA: Diagnosis not present

## 2018-12-15 DIAGNOSIS — L821 Other seborrheic keratosis: Secondary | ICD-10-CM | POA: Diagnosis not present

## 2018-12-15 DIAGNOSIS — C44629 Squamous cell carcinoma of skin of left upper limb, including shoulder: Secondary | ICD-10-CM | POA: Diagnosis not present

## 2018-12-18 DIAGNOSIS — N183 Chronic kidney disease, stage 3 unspecified: Secondary | ICD-10-CM | POA: Diagnosis not present

## 2018-12-18 DIAGNOSIS — D631 Anemia in chronic kidney disease: Secondary | ICD-10-CM | POA: Diagnosis not present

## 2018-12-18 DIAGNOSIS — I129 Hypertensive chronic kidney disease with stage 1 through stage 4 chronic kidney disease, or unspecified chronic kidney disease: Secondary | ICD-10-CM | POA: Diagnosis not present

## 2018-12-18 DIAGNOSIS — N390 Urinary tract infection, site not specified: Secondary | ICD-10-CM | POA: Diagnosis not present

## 2018-12-18 DIAGNOSIS — E875 Hyperkalemia: Secondary | ICD-10-CM | POA: Diagnosis not present

## 2018-12-29 DIAGNOSIS — C44629 Squamous cell carcinoma of skin of left upper limb, including shoulder: Secondary | ICD-10-CM | POA: Diagnosis not present

## 2019-01-12 DIAGNOSIS — L570 Actinic keratosis: Secondary | ICD-10-CM | POA: Diagnosis not present

## 2019-01-12 DIAGNOSIS — C44622 Squamous cell carcinoma of skin of right upper limb, including shoulder: Secondary | ICD-10-CM | POA: Diagnosis not present

## 2019-01-12 DIAGNOSIS — C4442 Squamous cell carcinoma of skin of scalp and neck: Secondary | ICD-10-CM | POA: Diagnosis not present

## 2019-01-28 DIAGNOSIS — R399 Unspecified symptoms and signs involving the genitourinary system: Secondary | ICD-10-CM | POA: Diagnosis not present

## 2019-02-05 DIAGNOSIS — Z1231 Encounter for screening mammogram for malignant neoplasm of breast: Secondary | ICD-10-CM | POA: Diagnosis not present

## 2019-02-05 DIAGNOSIS — M810 Age-related osteoporosis without current pathological fracture: Secondary | ICD-10-CM | POA: Diagnosis not present

## 2019-02-05 DIAGNOSIS — N959 Unspecified menopausal and perimenopausal disorder: Secondary | ICD-10-CM | POA: Diagnosis not present

## 2019-02-12 DIAGNOSIS — R52 Pain, unspecified: Secondary | ICD-10-CM | POA: Diagnosis not present

## 2019-02-12 DIAGNOSIS — I10 Essential (primary) hypertension: Secondary | ICD-10-CM | POA: Diagnosis not present

## 2019-02-12 DIAGNOSIS — R112 Nausea with vomiting, unspecified: Secondary | ICD-10-CM | POA: Diagnosis not present

## 2019-02-12 DIAGNOSIS — R519 Headache, unspecified: Secondary | ICD-10-CM | POA: Diagnosis not present

## 2019-02-14 ENCOUNTER — Encounter (HOSPITAL_BASED_OUTPATIENT_CLINIC_OR_DEPARTMENT_OTHER): Payer: Self-pay | Admitting: Emergency Medicine

## 2019-02-14 ENCOUNTER — Emergency Department (HOSPITAL_BASED_OUTPATIENT_CLINIC_OR_DEPARTMENT_OTHER)
Admission: EM | Admit: 2019-02-14 | Discharge: 2019-02-14 | Disposition: A | Discharge status: Home / Self Care | Payer: Medicare HMO | Attending: Emergency Medicine | Admitting: Emergency Medicine

## 2019-02-14 ENCOUNTER — Other Ambulatory Visit: Payer: Self-pay

## 2019-02-14 DIAGNOSIS — H5712 Ocular pain, left eye: Secondary | ICD-10-CM

## 2019-02-14 DIAGNOSIS — R112 Nausea with vomiting, unspecified: Secondary | ICD-10-CM | POA: Insufficient documentation

## 2019-02-14 DIAGNOSIS — I10 Essential (primary) hypertension: Secondary | ICD-10-CM | POA: Diagnosis not present

## 2019-02-14 DIAGNOSIS — R519 Headache, unspecified: Secondary | ICD-10-CM | POA: Diagnosis not present

## 2019-02-14 DIAGNOSIS — Z20828 Contact with and (suspected) exposure to other viral communicable diseases: Secondary | ICD-10-CM | POA: Diagnosis not present

## 2019-02-14 DIAGNOSIS — Z79899 Other long term (current) drug therapy: Secondary | ICD-10-CM | POA: Insufficient documentation

## 2019-02-14 DIAGNOSIS — H538 Other visual disturbances: Secondary | ICD-10-CM | POA: Diagnosis not present

## 2019-02-14 LAB — SARS CORONAVIRUS 2 AG (30 MIN TAT): SARS Coronavirus 2 Ag: NEGATIVE

## 2019-02-14 MED ORDER — TETRACAINE HCL 0.5 % OP SOLN
2.0000 [drp] | Freq: Once | OPHTHALMIC | Status: AC
  Administered 2019-02-14: 2 [drp] via OPHTHALMIC
  Filled 2019-02-14: qty 4

## 2019-02-14 MED ORDER — PROCHLORPERAZINE EDISYLATE 10 MG/2ML IJ SOLN
10.0000 mg | Freq: Once | INTRAMUSCULAR | Status: AC
  Administered 2019-02-14: 18:00:00 10 mg via INTRAVENOUS
  Filled 2019-02-14: qty 2

## 2019-02-14 MED ORDER — DEXAMETHASONE SODIUM PHOSPHATE 10 MG/ML IJ SOLN
8.0000 mg | Freq: Once | INTRAMUSCULAR | Status: AC
  Administered 2019-02-14: 8 mg via INTRAVENOUS
  Filled 2019-02-14: qty 1

## 2019-02-14 MED ORDER — SODIUM CHLORIDE 0.9 % IV BOLUS
500.0000 mL | Freq: Once | INTRAVENOUS | Status: AC
  Administered 2019-02-14: 17:00:00 500 mL via INTRAVENOUS

## 2019-02-14 MED ORDER — KETOROLAC TROMETHAMINE 15 MG/ML IJ SOLN
15.0000 mg | Freq: Once | INTRAMUSCULAR | Status: AC
  Administered 2019-02-14: 15 mg via INTRAVENOUS
  Filled 2019-02-14: qty 1

## 2019-02-14 MED ORDER — ACETAMINOPHEN 325 MG PO TABS
650.0000 mg | ORAL_TABLET | Freq: Once | ORAL | Status: AC
  Administered 2019-02-14: 650 mg via ORAL
  Filled 2019-02-14: qty 2

## 2019-02-14 NOTE — ED Notes (Signed)
ED Provider at bedside. 

## 2019-02-14 NOTE — ED Notes (Signed)
Dr. Langston Masker speaking with pt's daughter by phone

## 2019-02-14 NOTE — ED Notes (Signed)
Family updated that Provider is with patient at this time

## 2019-02-14 NOTE — Discharge Instructions (Addendum)
Important,  You need to follow-up with an ophthalmologist, and eye doctor, tomorrow morning at 815 in the office.  It is possible that you may be having episodes of acute angle glaucoma, need to be examined by a specialist for this. If you have a return of your sudden severe headache and pain behind your left eye, with loss of vision, you should come back to the ER immediately.  As I explained, there are many reasons why your blood pressure can be elevated.  If you are sick or having headaches, the pain can make your blood pressure go up.  We will not make any changes to your chronic blood pressure medicine.  You can continue taking the pill you normally do.  You need to call your primary care provider tomorrow morning to discuss this issue.  Your primary care provider should be the person managing your blood pressure  At home you can continue taking Tylenol or Motrin for the next several days for your headache, every 6- 8 hours. Do not take Motrin (ibuprofen 600 mg) for more than 7 days, as it can affect your kidneys or stomach.  Tomorrow (Monday, 12/21) go to [facility] at [place], [place], at 8:15 am promptly - Dr. Valetta Close will see you

## 2019-02-14 NOTE — ED Provider Notes (Addendum)
[facility] EMERGENCY DEPARTMENT Provider Note   CSN: [id number] Arrival date & time: 02/14/19  1422     History Chief Complaint  Patient presents with  . Headache    Samantha Owen is a 73 y.o. female with history of chronic kidney disease, hypertension, high cholesterol, presenting to the ED with headache and vision changes.  Patient reports that she used to be on antihypertensive medicines but has since stopped that in the past 2 months with her blood pressures were under control.  Typically her blood pressure runs around the range of 742 systolic.  However for the past week she has noticed her blood pressure spiking towards 595 systolic.  She had 2 episodes this week of a sudden onset headache and pain in her left eye.  The first occurred approximately 7 days ago and the second episode occurred approximately 3 days ago.  In both cases she describes sudden onset of severe left-sided headache and throbbing around her left eye.  She says she became extremely photophobic and had nausea as well as vomiting.  She said she felt like she could not even open up her left eye because the pain was so intense.  She says this has never happened her before.  She said these episodes last anywhere from 4 hours to 8 hours.  This seemed to resolve spontaneously on their own.  Immediately following her second event, she called 911 and was taken to [facility] emergency department.  She reports that they checked her blood work and did a CT scan of her head and told her it was normal (I verified with their ED there were no stroke findings on CT head, her Cr was 1.1 at the time).  She also reports that they did an ocular exam including checking the pressure in her eye and told her that "everything was normal, and it's probably a migraine."  She denies any prior history of migraines or headaches.  She denies any history of strokes.  Today she was checking her blood pressure again noted it was elevated.   She tried to take one of her lisinopril HCTZ tablets and rechecked her pressure 15 minutes later and found that it was still over 638 systolic.  She talked to her neighbor, who is a nurse who told her she should come to the ER to get checked out.  In the ED she does complain of a frontal headache that is pounding, 9/10 pain. She denies any vision changes or eye pain currently.  She denies chest pain or palpitations.  She denies any urinary symptoms or difficulties  She wears glasses and is nearsighted at baseline.  She reports the vision in her left eye is always more worse in the vision in her right eye.  She does report a distant history of cataract lens surgery perhaps 10 years ago.  She denies any known history of glaucoma.  Her next ophthalmology appointment is in March 2021   HPI     Past Medical History:  Diagnosis Date  . Hypercholesteremia   . Hypertension   . Osteoporosis   . Renal disorder     There are no problems to display for this patient.   Past Surgical History:  Procedure Laterality Date  . ABDOMINAL HYSTERECTOMY    . APPENDECTOMY       OB History   No obstetric history on file.     No family history on file.  Social History   Tobacco Use  . Smoking status:  Never Smoker  . Smokeless tobacco: Never Used  Substance Use Topics  . Alcohol use: Never  . Drug use: Never    Home Medications Prior to Admission medications   Medication Sig Start Date End Date Taking? Authorizing Provider  lisinopril-hydrochlorothiazide (ZESTORETIC) 10-12.5 MG tablet Take 1 tablet by mouth daily.  10/10/18  [provider]  alendronate (FOSAMAX) 70 MG tablet Take 70 mg by mouth once a week. Take with a full glass of water on an empty stomach.    [provider]  atorvastatin (LIPITOR) 20 MG tablet Take 20 mg by mouth daily.    [provider]  gabapentin (NEURONTIN) 100 MG capsule Take 100 mg by mouth 3 (three) times daily.    [provider]   omeprazole (PRILOSEC) 20 MG capsule Take 20 mg by mouth daily.    [provider]    Allergies    Patient has no known allergies.  Review of Systems   Review of Systems  Constitutional: Negative for chills and fever.  Eyes: Positive for photophobia, pain, redness and visual disturbance.  Respiratory: Negative for cough and shortness of breath.   Cardiovascular: Negative for chest pain and palpitations.  Gastrointestinal: Positive for nausea and vomiting. Negative for abdominal pain.  Genitourinary: Negative for difficulty urinating and flank pain.  Musculoskeletal: Negative for arthralgias and back pain.  Skin: Negative for pallor and rash.  Neurological: Positive for headaches. Negative for syncope.  Psychiatric/Behavioral: Negative for agitation and confusion.  All other systems reviewed and are negative.   Physical Exam Updated Vital Signs BP (!) 161/65 (BP Location: Left Arm)   Pulse (!) 54   Temp 98 F (36.7 C) (Oral)   Resp 16   Ht 5\' 2"  (1.575 m)   Wt 62.6 kg   SpO2 100%   BMI 25.24 kg/m   Physical Exam Vitals and nursing note reviewed.  Constitutional:      General: She is not in acute distress.    Appearance: She is well-developed.  HENT:     Head: Normocephalic and atraumatic.  Eyes:     General: No visual field deficit.    Conjunctiva/sclera: Conjunctivae normal.     Comments: Vision 20/50 in left eye and 20/20 in right eye (wearing corrective lenses) Full ROM  Cardiovascular:     Rate and Rhythm: Normal rate and regular rhythm.  Pulmonary:     Effort: Pulmonary effort is normal. No respiratory distress.     Breath sounds: Normal breath sounds.  Abdominal:     Palpations: Abdomen is soft.  Musculoskeletal:     Cervical back: Normal range of motion and neck supple.  Skin:    General: Skin is warm and dry.  Neurological:     Mental Status: She is alert.     GCS: GCS eye subscore is 4. GCS verbal subscore is 5. GCS motor subscore is 6.      Cranial Nerves: No cranial nerve deficit, dysarthria or facial asymmetry.     Sensory: No sensory deficit.     Motor: No weakness.  Psychiatric:        Mood and Affect: Mood normal.        Behavior: Behavior normal.     ED Results / Procedures / Treatments   Labs (all labs ordered are listed, but only abnormal results are displayed) Labs Reviewed  SARS CORONAVIRUS 2 AG (30 MIN TAT)    EKG None  Radiology No results found.  Procedures Procedures (including critical care  time)  Medications Ordered in ED Medications  tetracaine (PONTOCAINE) 0.5 % ophthalmic solution 2 drop (2 drops Both Eyes Given by Other 02/14/19 1532)  acetaminophen (TYLENOL) tablet 650 mg (650 mg Oral Given 02/14/19 1700)  ketorolac (TORADOL) 15 MG/ML injection 15 mg (15 mg Intravenous Given 02/14/19 1732)  sodium chloride 0.9 % bolus 500 mL (0 mLs Intravenous Stopped 02/14/19 1850)  dexamethasone (DECADRON) injection 8 mg (8 mg Intravenous Given 02/14/19 1729)  prochlorperazine (COMPAZINE) injection 10 mg (10 mg Intravenous Given 02/14/19 1732)    ED Course  I have reviewed the triage vital signs and the nursing notes.  Pertinent labs & imaging results that were available during my care of the patient were reviewed by me and considered in my medical decision making (see chart for details).  This is a well-appearing 73 year old female presents to the ED with 2 episodes of sudden onset left-sided head pain, pain in her left eye, nausea and vomiting, concerning for episodes of acute angle glaucoma.  She reports this resolved on their own.  After second episode she did go to [facility] emergency department, had CTH negative for stroke or ICH at the time. She does report that they checked her eye pressure and told her it was normal, that she may be having a migraine headache.   Her blood pressure has been labile this week may be related to the pain that she has been experiencing.  I advised that she restart her  BP medicines.  She tells me that the migraine cocktail at [facility] ER significantly improved her headache and her BP, so we will try the same here.  She otherwise has a benign neurological exam, and I do not believe she is having a stroke or hypertensive emergency.  Rather, her constellation of symptoms again is more concerning for acute angle glaucoma during this week.  Lower suspicion for retinal detachment, retinal occlusion.  Plan to do another ocular exam and check the pressure in her eye.  I will consider discharging her with timolol drops and follow-up with ophthalmology.  I explained she needs to reach out to her nephrologist or her primary care doctor to discuss the fluctuations in her blood pressure.    Samantha Owen was evaluated in Emergency Department on 02/14/2019 for the symptoms described in the history of present illness. She was evaluated in the context of the global COVID-19 pandemic, which necessitated consideration that the patient might be at risk for infection with the SARS-CoV-2 virus that causes COVID-19. Institutional protocols and algorithms that pertain to the evaluation of patients at risk for COVID-19 are in a state of rapid change based on information released by regulatory bodies including the CDC and federal and state organizations. These policies and algorithms were followed during the patient's care in the ED.  This note was dictated using dragon dictation software.  Please be aware that there may be minor translation errors as a result of this oral dictation  Clinical Course as of Feb 14 2027  Sun Feb 14, 2019  1621 Ocular pressure left eye 18 mm hg, left eye 26 mm hg   [MT]  1718 I spoke to Dr Valetta Close of ophtho who recommends patient come to his office at 815 AM tomorrow for urgent follow up visit, no eye medications needed at this time.  I will give the patient an IV cocktail for her headache, which I suspect may be contributing to her hypertension.  I spoke to  her daughter by phone and reviewed this  plan, she is in agreement.  I explained that the patient is not showing me any signs or symptoms of stroke at this time, and that HA and HTN may be contributing to one another.     [MT]  0698 SARS Coronavirus 2 Ag: NEGATIVE [MT]  1904 Pt reporting headache improvement to 3/10 now, BP improved, covid negative, will call daughter to pick her up, explained need for ophtho f/u tomorrow   [MT]    Clinical Course User Index [MT] Marv Alfrey, Carola Rhine, MD    Final Clinical Impression(s) / ED Diagnoses Final diagnoses:  Hypertension, unspecified type  Nonintractable headache, unspecified chronicity pattern, unspecified headache type  Left eye pain    Rx / DC Orders ED Discharge Orders    None       Tashina Credit, Carola Rhine, MD 02/14/19 2023    Wyvonnia Dusky, MD 02/14/19 2028

## 2019-02-14 NOTE — ED Triage Notes (Signed)
Headache today. High BP readings at home. Similar episode earlier this week and seen at [facility].

## 2019-02-14 NOTE — ED Notes (Signed)
Pt's daughter's Erskine Squibb [phone] and Eldridge Scot [phone] request call with update on pt condition

## 2019-02-15 DIAGNOSIS — Z6826 Body mass index (BMI) 26.0-26.9, adult: Secondary | ICD-10-CM | POA: Diagnosis not present

## 2019-02-15 DIAGNOSIS — R519 Headache, unspecified: Secondary | ICD-10-CM | POA: Diagnosis not present

## 2019-02-15 DIAGNOSIS — I10 Essential (primary) hypertension: Secondary | ICD-10-CM | POA: Diagnosis not present

## 2019-02-15 DIAGNOSIS — H5712 Ocular pain, left eye: Secondary | ICD-10-CM | POA: Diagnosis not present

## 2019-02-15 DIAGNOSIS — H53002 Unspecified amblyopia, left eye: Secondary | ICD-10-CM | POA: Diagnosis not present

## 2019-02-15 DIAGNOSIS — H18593 Other hereditary corneal dystrophies, bilateral: Secondary | ICD-10-CM | POA: Diagnosis not present

## 2019-02-17 DIAGNOSIS — N390 Urinary tract infection, site not specified: Secondary | ICD-10-CM | POA: Diagnosis not present

## 2019-02-19 DIAGNOSIS — R519 Headache, unspecified: Secondary | ICD-10-CM | POA: Diagnosis not present

## 2019-02-19 DIAGNOSIS — I10 Essential (primary) hypertension: Secondary | ICD-10-CM | POA: Diagnosis not present

## 2019-02-20 ENCOUNTER — Observation Stay (HOSPITAL_COMMUNITY): Payer: Medicare HMO

## 2019-02-20 ENCOUNTER — Emergency Department (HOSPITAL_BASED_OUTPATIENT_CLINIC_OR_DEPARTMENT_OTHER): Payer: Medicare HMO

## 2019-02-20 ENCOUNTER — Observation Stay (HOSPITAL_BASED_OUTPATIENT_CLINIC_OR_DEPARTMENT_OTHER)
Admission: EM | Admit: 2019-02-20 | Discharge: 2019-02-21 | Disposition: A | Discharge status: Home / Self Care | Payer: Medicare HMO | Attending: Family Medicine | Admitting: Family Medicine

## 2019-02-20 ENCOUNTER — Other Ambulatory Visit: Payer: Self-pay

## 2019-02-20 ENCOUNTER — Encounter (HOSPITAL_BASED_OUTPATIENT_CLINIC_OR_DEPARTMENT_OTHER): Payer: Self-pay | Admitting: Emergency Medicine

## 2019-02-20 DIAGNOSIS — E7800 Pure hypercholesterolemia, unspecified: Secondary | ICD-10-CM | POA: Insufficient documentation

## 2019-02-20 DIAGNOSIS — Z66 Do not resuscitate: Secondary | ICD-10-CM | POA: Diagnosis not present

## 2019-02-20 DIAGNOSIS — Z79899 Other long term (current) drug therapy: Secondary | ICD-10-CM | POA: Insufficient documentation

## 2019-02-20 DIAGNOSIS — R634 Abnormal weight loss: Secondary | ICD-10-CM | POA: Diagnosis not present

## 2019-02-20 DIAGNOSIS — G43901 Migraine, unspecified, not intractable, with status migrainosus: Principal | ICD-10-CM | POA: Insufficient documentation

## 2019-02-20 DIAGNOSIS — R519 Headache, unspecified: Secondary | ICD-10-CM | POA: Diagnosis present

## 2019-02-20 DIAGNOSIS — M6281 Muscle weakness (generalized): Secondary | ICD-10-CM | POA: Insufficient documentation

## 2019-02-20 DIAGNOSIS — E785 Hyperlipidemia, unspecified: Secondary | ICD-10-CM | POA: Diagnosis not present

## 2019-02-20 DIAGNOSIS — Z20828 Contact with and (suspected) exposure to other viral communicable diseases: Secondary | ICD-10-CM | POA: Diagnosis not present

## 2019-02-20 DIAGNOSIS — I6782 Cerebral ischemia: Secondary | ICD-10-CM | POA: Insufficient documentation

## 2019-02-20 DIAGNOSIS — N1832 Chronic kidney disease, stage 3b: Secondary | ICD-10-CM | POA: Insufficient documentation

## 2019-02-20 DIAGNOSIS — I10 Essential (primary) hypertension: Secondary | ICD-10-CM | POA: Diagnosis present

## 2019-02-20 DIAGNOSIS — R112 Nausea with vomiting, unspecified: Secondary | ICD-10-CM | POA: Insufficient documentation

## 2019-02-20 DIAGNOSIS — I129 Hypertensive chronic kidney disease with stage 1 through stage 4 chronic kidney disease, or unspecified chronic kidney disease: Secondary | ICD-10-CM | POA: Insufficient documentation

## 2019-02-20 HISTORY — DX: Chronic kidney disease, stage 3 unspecified: N18.30

## 2019-02-20 LAB — CBC WITH DIFFERENTIAL/PLATELET
Abs Immature Granulocytes: 0.03 10*3/uL (ref 0.00–0.07)
Basophils Absolute: 0 10*3/uL (ref 0.0–0.1)
Basophils Relative: 0 %
Eosinophils Absolute: 0 10*3/uL (ref 0.0–0.5)
Eosinophils Relative: 0 %
HCT: 34.1 % — ABNORMAL LOW (ref 36.0–46.0)
Hemoglobin: 11.3 g/dL — ABNORMAL LOW (ref 12.0–15.0)
Immature Granulocytes: 0 %
Lymphocytes Relative: 23 %
Lymphs Abs: 2 10*3/uL (ref 0.7–4.0)
MCH: 31.5 pg (ref 26.0–34.0)
MCHC: 33.1 g/dL (ref 30.0–36.0)
MCV: 95 fL (ref 80.0–100.0)
Monocytes Absolute: 0.9 10*3/uL (ref 0.1–1.0)
Monocytes Relative: 11 %
Neutro Abs: 5.7 10*3/uL (ref 1.7–7.7)
Neutrophils Relative %: 66 %
Platelets: 340 10*3/uL (ref 150–400)
RBC: 3.59 MIL/uL — ABNORMAL LOW (ref 3.87–5.11)
RDW: 11.7 % (ref 11.5–15.5)
WBC: 8.7 10*3/uL (ref 4.0–10.5)
nRBC: 0 % (ref 0.0–0.2)

## 2019-02-20 LAB — BASIC METABOLIC PANEL
Anion gap: 9 (ref 5–15)
BUN: 37 mg/dL — ABNORMAL HIGH (ref 8–23)
CO2: 24 mmol/L (ref 22–32)
Calcium: 9.6 mg/dL (ref 8.9–10.3)
Chloride: 105 mmol/L (ref 98–111)
Creatinine, Ser: 1.51 mg/dL — ABNORMAL HIGH (ref 0.44–1.00)
GFR calc Af Amer: 39 mL/min — ABNORMAL LOW (ref >60)
GFR calc non Af Amer: 34 mL/min — ABNORMAL LOW (ref >60)
Glucose, Bld: 122 mg/dL — ABNORMAL HIGH (ref 70–99)
Potassium: 4.2 mmol/L (ref 3.5–5.1)
Sodium: 138 mmol/L (ref 135–145)

## 2019-02-20 LAB — SARS CORONAVIRUS 2 (TAT 6-24 HRS): SARS Coronavirus 2: NEGATIVE

## 2019-02-20 LAB — C-REACTIVE PROTEIN: CRP: <0.5 mg/dL (ref <1.0)

## 2019-02-20 LAB — SEDIMENTATION RATE: Sed Rate: 11 mm/hr (ref 0–22)

## 2019-02-20 MED ORDER — DEXAMETHASONE SODIUM PHOSPHATE 10 MG/ML IJ SOLN
10.0000 mg | Freq: Once | INTRAMUSCULAR | Status: AC
  Administered 2019-02-20: 04:00:00 10 mg via INTRAVENOUS
  Filled 2019-02-20: qty 1

## 2019-02-20 MED ORDER — ENOXAPARIN SODIUM 40 MG/0.4ML ~~LOC~~ SOLN
40.0000 mg | SUBCUTANEOUS | Status: DC
  Administered 2019-02-20: 40 mg via SUBCUTANEOUS
  Filled 2019-02-20: qty 0.4

## 2019-02-20 MED ORDER — ACETAMINOPHEN 650 MG RE SUPP: 650.0000 mg | RECTAL | Status: DC | PRN

## 2019-02-20 MED ORDER — STROKE: EARLY STAGES OF RECOVERY BOOK
Freq: Once | Status: AC
  Administered 2019-02-20: 1
  Filled 2019-02-20: qty 1

## 2019-02-20 MED ORDER — ATORVASTATIN CALCIUM 10 MG PO TABS
20.0000 mg | ORAL_TABLET | Freq: Every day | ORAL | Status: DC
  Administered 2019-02-21: 20 mg via ORAL
  Filled 2019-02-20: qty 2

## 2019-02-20 MED ORDER — GADOBUTROL 1 MMOL/ML IV SOLN
6.0000 mL | Freq: Once | INTRAVENOUS | Status: AC | PRN
  Administered 2019-02-20: 6 mL via INTRAVENOUS

## 2019-02-20 MED ORDER — MORPHINE SULFATE (PF) 2 MG/ML IV SOLN: 2.0000 mg | INTRAVENOUS | Status: DC | PRN

## 2019-02-20 MED ORDER — LORAZEPAM 2 MG/ML IJ SOLN
1.0000 mg | Freq: Once | INTRAMUSCULAR | Status: AC
  Administered 2019-02-20: 1 mg via INTRAVENOUS
  Filled 2019-02-20: qty 1

## 2019-02-20 MED ORDER — KETOROLAC TROMETHAMINE 30 MG/ML IJ SOLN
30.0000 mg | Freq: Once | INTRAMUSCULAR | Status: AC
  Administered 2019-02-20: 04:00:00 30 mg via INTRAVENOUS
  Filled 2019-02-20: qty 1

## 2019-02-20 MED ORDER — MORPHINE SULFATE (PF) 4 MG/ML IV SOLN
4.0000 mg | Freq: Once | INTRAVENOUS | Status: AC
  Administered 2019-02-20: 4 mg via INTRAVENOUS
  Filled 2019-02-20: qty 1

## 2019-02-20 MED ORDER — METOCLOPRAMIDE HCL 5 MG/ML IJ SOLN
10.0000 mg | Freq: Once | INTRAMUSCULAR | Status: AC
  Administered 2019-02-20: 04:00:00 10 mg via INTRAVENOUS
  Filled 2019-02-20: qty 2

## 2019-02-20 MED ORDER — ACETAMINOPHEN 325 MG PO TABS: 650.0000 mg | ORAL_TABLET | ORAL | Status: DC | PRN

## 2019-02-20 MED ORDER — SODIUM CHLORIDE 0.9 % IV BOLUS
1000.0000 mL | Freq: Once | INTRAVENOUS | Status: AC
  Administered 2019-02-20: 1000 mL via INTRAVENOUS

## 2019-02-20 MED ORDER — PANTOPRAZOLE SODIUM 40 MG PO TBEC
40.0000 mg | DELAYED_RELEASE_TABLET | Freq: Every day | ORAL | Status: DC
  Administered 2019-02-21: 40 mg via ORAL
  Filled 2019-02-20 (×2): qty 1

## 2019-02-20 MED ORDER — ASPIRIN EC 81 MG PO TBEC
81.0000 mg | DELAYED_RELEASE_TABLET | Freq: Every day | ORAL | Status: DC
  Administered 2019-02-20 – 2019-02-21 (×2): 81 mg via ORAL
  Filled 2019-02-20 (×2): qty 1

## 2019-02-20 MED ORDER — DIPHENHYDRAMINE HCL 50 MG/ML IJ SOLN
25.0000 mg | Freq: Once | INTRAMUSCULAR | Status: AC
  Administered 2019-02-20: 04:00:00 25 mg via INTRAVENOUS
  Filled 2019-02-20: qty 1

## 2019-02-20 MED ORDER — SENNOSIDES-DOCUSATE SODIUM 8.6-50 MG PO TABS: 1.0000 | ORAL_TABLET | Freq: Every evening | ORAL | Status: DC | PRN

## 2019-02-20 MED ORDER — SODIUM CHLORIDE 0.9 % IV SOLN: INTRAVENOUS | Status: DC

## 2019-02-20 MED ORDER — GABAPENTIN 100 MG PO CAPS
100.0000 mg | ORAL_CAPSULE | Freq: Three times a day (TID) | ORAL | Status: DC
  Administered 2019-02-20 – 2019-02-21 (×3): 100 mg via ORAL
  Filled 2019-02-20 (×3): qty 1

## 2019-02-20 MED ORDER — ACETAMINOPHEN 160 MG/5ML PO SOLN: 650.0000 mg | ORAL | Status: DC | PRN

## 2019-02-20 NOTE — Progress Notes (Signed)
New Admission Note:  Arrival Method: via [facility] from [facility] Mental Orientation: Alert and Oriented person, place, time and situation Telemetry: Box 27 monitor rhythm Sinus Loletha Grayer Assessment: Completed Skin: Clean and dry. Bilateral bruising on both upper extremities IV:  22 G Rt forearm Pain: states current pain level is 1/10 in the front of her head that feel like pressure. Safety Measures: Safety Fall Prevention Plan was given, discussed. Admission: Completed 3W: Patient has been orientated to the room, unit and the staff. Family: Daughter is at bedside and pleasant and supportive.  Orders have been reviewed and implemented. Will continue to monitor the patient. Call light has been placed within reach and bed alarm has been activated.   Janus Molder ,RN

## 2019-02-20 NOTE — ED Notes (Signed)
ED Provider at bedside. 

## 2019-02-20 NOTE — Consult Note (Addendum)
NEURO HOSPITALIST CONSULT NOTE   Requesting physician: Dr. Lorin Mercy   Reason for Consult: Headache   History obtained from:  Patient  / chart review  HPI:                                                                                                                                          Samantha Owen is an 73 y.o. female  With PMH HTN, HLD, CKD 3 who presented to med center [facility] for c/o 10 days of intermittent HA. Patient transferred to [facility] for MRI.    Patient has been seen 4 times in the last 10 days for this intermittent HA. Patient states that about a week ago Wednesday when she was sitting down in the cafe eating she caught a cold chill, and then had sudden sharp pains behind her left eye.all other episodes pressure has occurred behind her right eye. The next few times that it has happened it has been pain behind her right eye. She describes the pain as pressure 10/10 " feels like her eye is about to pop out".  This does happen pretty quick. She does have photophobia when this happens. She will first feel some pressure behind her eye that is painful that becomes severely painful in about 30 seconds. Also, she has nausea and vomiting. Stating she has not really been able to eat and drink much over the past 10 days d/t the vomiting. One episodes she noted that her SBP was 190 and with that her head was also tender in addition to the eye pain. Denies floaters, phonophobia, diplopia, CP, SOB. 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 Patient has taken ibuprofen once for the pain and it did not touch the pain. Denies history of migraines or having had this happen prior to 10 days ago. The episodes can last for hours.  CTH: nothing acute. BP: 149/57   Past Medical History:  Diagnosis Date  . CKD (chronic kidney disease) stage 3, GFR 30-59 ml/min   . Hypercholesteremia   . Hypertension   . Osteoporosis     Past Surgical History:  Procedure Laterality Date  .  ABDOMINAL HYSTERECTOMY    . APPENDECTOMY      Family History  Problem Relation Age of Onset  . Diabetes Father 53  . Dementia Other        siblings, multiple  . Diabetes Daughter   . Stroke Neg Hx         Social History:  reports that she has never smoked. She has never used smokeless tobacco. She reports current alcohol use. She reports that she does not use drugs.  No Known Allergies  MEDICATIONS:  Scheduled: . aspirin EC  81 mg Oral Daily  . [START ON 02/21/2019] atorvastatin  20 mg Oral Daily  . enoxaparin (LOVENOX) injection  40 mg Subcutaneous Q24H  . gabapentin  100 mg Oral TID  . LORazepam  1 mg Intravenous Once  . [START ON 02/21/2019] pantoprazole  40 mg Oral Daily   Continuous: . sodium chloride     XBL:[id number] **OR** acetaminophen (TYLENOL) oral liquid 160 mg/5 mL **OR** acetaminophen, morphine injection, senna-docusate   ROS:                                                                                                                                       ROS was performed and is negative except as noted in HPI   Blood pressure (!) 147/57, pulse (!) 54, temperature 98.9 F (37.2 C), temperature source Oral, resp. rate 15, height 5\' 2"  (1.575 m), weight 66.9 kg, SpO2 99 %.   General Examination:                                                                                                       Physical Exam  Constitutional: Appears well-developed and well-nourished.  Psych: Affect appropriate to situation Eyes: Normal external eye and conjunctiva. HENT: Normocephalic, no lesions, without obvious abnormality.   Musculoskeletal-no joint tenderness, deformity or swelling Cardiovascular: Normal rate and regular rhythm.  Respiratory: Effort normal, non-labored breathing saturations WNL  on RA GI: Soft.  No distension. There is no  tenderness.  Skin: WDI  Neurological Examination Mental Status: Alert, oriented, thought content appropriate.  Speech fluent without evidence of aphasia.  Able to follow commands without difficulty. Does have speech impediment at baseline Cranial Nerves: II: Discs flat bilaterally; Visual fields grossly normal,  III,IV, VI: ptosis not present, extra-ocular motions intact bilaterally pupils equal, round, reactive to light and accommodation V,VII: smile symmetric, facial light touch sensation normal bilaterally VIII: hearing normal bilaterally IX,X: uvula rises symmetrically XI: bilateral shoulder shrug XII: midline tongue extension Motor: Right : Upper extremity   5/5  Left:     Upper extremity   5/5  Lower extremity   5/5   Lower extremity   5/5 Tone and bulk:normal tone throughout; no atrophy noted Sensory: light touch intact throughout, bilaterally Deep Tendon Reflexes: 2+ and symmetric throughout Plantars: Right: downgoing   Left: downgoing Cerebellar: No ataxia noted Gait: deferred   Lab Results: Basic Metabolic Panel: Recent Labs  Lab 02/20/19 0357  NA 138  K 4.2  CL 105  CO2 24  GLUCOSE 122*  BUN 37*  CREATININE 1.51*  CALCIUM 9.6    CBC: Recent Labs  Lab 02/20/19 0357  WBC 8.7  NEUTROABS 5.7  HGB 11.3*  HCT 34.1*  MCV 95.0  PLT 340    Imaging: CT Head Wo Contrast  Result Date: 02/20/2019 CLINICAL DATA:  Headache EXAM: CT HEAD WITHOUT CONTRAST TECHNIQUE: Contiguous axial images were obtained from the base of the skull through the vertex without intravenous contrast. COMPARISON:  None. FINDINGS: Brain: There is no mass, hemorrhage or extra-axial collection. The size and configuration of the ventricles and extra-axial CSF spaces are normal. The brain parenchyma is normal, without acute or chronic infarction. Bilateral basal ganglia mineralization. Vascular: No abnormal hyperdensity of the major intracranial arteries or dural venous sinuses. No intracranial  atherosclerosis. Skull: The visualized skull base, calvarium and extracranial soft tissues are normal. Sinuses/Orbits: No fluid levels or advanced mucosal thickening of the visualized paranasal sinuses. No mastoid or middle ear effusion. The orbits are normal. IMPRESSION: No acute intracranial abnormality. Electronically Signed   By: Ulyses Jarred M.D.   On: 02/20/2019 04:59   Laurey Morale, MSN, NP-C [facility] [phone]  Attending neurologist's note to follow  I have seen the patient reviewed the above note.  She very clearly describes unilateral retro-orbital throbbing headache associated with photophobia and nausea/vomiting which last for hours.  She does not remember ever having headaches that make her want to lay down in a dark room.  Assessment: 73 year old female with new onset unilateral retro-orbital throbbing headache associated with photophobia and nausea/vomiting.  The semiology is very much most consistent with migraine, though the onset at this late stage of life is very unusual.  The clear semiology of migraine coupled with the low sed rate make me think that temporal arteritis is very unlikely.  The abrupt onset makes CNS vasculitis less likely, this could also be considered.  The fact that it is always unilateral, but switches sides makes orbital pathology much less likely.  Venous sinus thrombosis is also usually more of a progressive headache, but would still like to evaluate for this.  I have usually heard headache due to hypertensive emergency described as more of a holocephalic pain, or making tension type headaches rather than the migraine.  That being said, her blood pressure has been elevated with these events, and sometimes it is difficult to tell whether the pain is causing hypertension or the hypertension is possible for the headache.  I do think that serious etiologies could be evaluated with MRI/MRV/MRA.  Recommendations: 1) MRI w/ wo  contrast 2) MRV head 3) MRA head 4) if this is negative, I would favor managing this according to the semiology which is migraine starting prophylactic therapy.   Roland Rack, MD [facility] [phone]  If 7pm- 7am, please page neurology on call as listed in [place].

## 2019-02-20 NOTE — ED Notes (Signed)
Report given to [facility]. 

## 2019-02-20 NOTE — ED Provider Notes (Signed)
[facility] Provider Note   CSN: [id number] Arrival date & time: 02/20/19  0257     History Chief Complaint  Patient presents with  . Headache  . Nausea    Samantha Owen is a 73 y.o. female.  Patient is a 73 year old female with past medical history of hypertension, hyperlipidemia.  She presents today for evaluation of headache.  This is her fourth trip to the emergency department in the past 10 days with similar complaints.  Patient describes a sharp, stabbing pain behind her right eye that started abruptly just prior to presentation.  She reports nausea but denies vomiting.  Patient has had similar episodes over the the past 10 days and has been seen at [facility] twice and is now here at [facility] for the second time.  She has had a CT scan which was unremarkable.  She was also referred to ophthalmology and was given a clear exam with the eye not felt to be the cause of her pain.  Patient denies weakness or numbness.  She denies any visual disturbances, with the exception of her vision being blurry when the pain is severe.  The history is provided by the patient.  Headache Pain location:  R temporal Quality:  Stabbing Radiates to:  Does not radiate Duration:  10 days Timing:  Intermittent Progression:  Worsening Chronicity:  New Relieved by:  Nothing Worsened by:  Nothing Ineffective treatments:  None tried      Past Medical History:  Diagnosis Date  . Hypercholesteremia   . Hypertension   . Osteoporosis   . Renal disorder     There are no problems to display for this patient.   Past Surgical History:  Procedure Laterality Date  . ABDOMINAL HYSTERECTOMY    . APPENDECTOMY       OB History   No obstetric history on file.     History reviewed. No pertinent family history.  Social History   Tobacco Use  . Smoking status: Never Smoker  . Smokeless tobacco: Never Used  Substance Use Topics  . Alcohol  use: Never  . Drug use: Never    Home Medications Prior to Admission medications   Medication Sig Start Date End Date Taking? Authorizing Provider  lisinopril-hydrochlorothiazide (ZESTORETIC) 10-12.5 MG tablet Take 1 tablet by mouth daily.  10/10/18  [provider]  alendronate (FOSAMAX) 70 MG tablet Take 70 mg by mouth once a week. Take with a full glass of water on an empty stomach.    [provider]  atorvastatin (LIPITOR) 20 MG tablet Take 20 mg by mouth daily.    [provider]  gabapentin (NEURONTIN) 100 MG capsule Take 100 mg by mouth 3 (three) times daily.    [provider]  omeprazole (PRILOSEC) 20 MG capsule Take 20 mg by mouth daily.    [provider]    Allergies    Patient has no known allergies.  Review of Systems   Review of Systems  Neurological: Positive for headaches.  All other systems reviewed and are negative.   Physical Exam Updated Vital Signs BP (!) 190/69 (BP Location: Right Arm) Comment: Simultaneous filing. User may not have seen previous data.  Pulse 61   Temp 98.2 F (36.8 C) (Oral)   Resp 20   Ht 5\' 2"  (1.575 m)   Wt 63.5 kg   SpO2 100%   BMI 25.61 kg/m   Physical Exam Vitals and nursing note reviewed.  Constitutional:  General: She is not in acute distress.    Appearance: She is well-developed. She is not diaphoretic.  HENT:     Head: Normocephalic and atraumatic.  Eyes:     General: No visual field deficit.    Extraocular Movements: Extraocular movements intact.     Right eye: Normal extraocular motion.     Left eye: Normal extraocular motion.     Pupils: Pupils are equal, round, and reactive to light.  Cardiovascular:     Rate and Rhythm: Normal rate and regular rhythm.     Heart sounds: No murmur. No friction rub. No gallop.   Pulmonary:     Effort: Pulmonary effort is normal. No respiratory distress.     Breath sounds: Normal breath sounds. No wheezing.  Abdominal:      General: Bowel sounds are normal. There is no distension.     Palpations: Abdomen is soft.     Tenderness: There is no abdominal tenderness.  Musculoskeletal:        General: Normal range of motion.     Cervical back: Normal range of motion and neck supple.  Skin:    General: Skin is warm and dry.  Neurological:     Mental Status: She is alert and oriented to person, place, and time.     Cranial Nerves: No cranial nerve deficit.     Sensory: No sensory deficit.     Motor: No weakness.     Coordination: Coordination normal.     ED Results / Procedures / Treatments   Labs (all labs ordered are listed, but only abnormal results are displayed) Labs Reviewed  BASIC METABOLIC PANEL  CBC WITH DIFFERENTIAL/PLATELET    EKG None  Radiology No results found.  Procedures Procedures (including critical care time)  Medications Ordered in ED Medications  sodium chloride 0.9 % bolus 1,000 mL (has no administration in time range)  ketorolac (TORADOL) 30 MG/ML injection 30 mg (has no administration in time range)  diphenhydrAMINE (BENADRYL) injection 25 mg (has no administration in time range)  dexamethasone (DECADRON) injection 10 mg (has no administration in time range)  metoCLOPramide (REGLAN) injection 10 mg (has no administration in time range)    ED Course  I have reviewed the triage vital signs and the nursing notes.  Pertinent labs & imaging results that were available during my care of the patient were reviewed by me and considered in my medical decision making (see chart for details).    MDM Rules/Calculators/A&P  Patient is a 73 year old female presenting with headache.  This is her fourth visit to the ER with similar complaints.  Today's work-up reveals a negative head CT and unremarkable laboratory studies.  Patient not feeling any better after a migraine cocktail and morphine.  Patient care discussed with Dr. Cheral Marker.  Patient will be admitted to the hospitalist  service for further work-up into what appears to be an intractable migraine.  Final Clinical Impression(s) / ED Diagnoses Final diagnoses:  None    Rx / DC Orders ED Discharge Orders    None       Veryl Speak, MD 02/20/19 [id number]

## 2019-02-20 NOTE — H&P (Signed)
History and Physical    Samantha Owen JSE:[id number] DOB: 1945/04/19 DOA: 02/20/2019  PCP: Lowella Dandy, NP Consultants:  Moshe Cipro - nephrology Patient coming from: Home - lives with husband; NOK: Daughter, Eldridge Scot, [phone]  Chief Complaint: Headache  HPI: Samantha Owen is a 73 y.o. female with medical history significant of HTN; Stage 3-4 CKD; and HLD presenting to [facility] with severe, refractory headache.  She does not tend to have headaches.  She was at a restaurant and she developed headache with pain behind her left eyeball.  She developed n/v that night with ongoing pain.  That was on Wednesday, week before last.  The following day, she went to the ER and she was discharged.  It happened again the next afternoon.  Then she developed pain behind her right eye.  She has been back and forth to the ER 3-4 times.  She woke up last night to go to the bathroom and she again had severe pain behind her R eye.  When her BP gets very high, she also develops pain in the top of her head.  No diplopia or blurry vision.  Intermittent dysphagia, rare.  She had stuttering at the onset of symptoms but none since.Last night, her left hand went numb. She did go to an eye doctor and he didn't see anything unusual, per her report.  She does have skin lesions on the top of her head that she says have been treated by dermatology - possibly last in October.  The pain on the top of her head may be associated with this.  She also reports anorexia and 8 pounds unintentional weight loss in maybe the last 4 weeks.    I spoke with her daughter.  She has a speech impediment and they are concerned that no one is understanding her.  Family was concerned that BP was setting of headaches and now are thinking the headaches are setting off her headaches.  Her daughter reports they thought it was a stroke the first time, which was last Friday (12/18) at [facility] - BP was 195/-.  Headache recurred severely on Sunday night, and  they took her to [facility] (12/20). She was given migraine medication with some improvement and then discharged again.  She went to eye doctor on Monday (12/21, Dr. Owens Shark in [place]) and he evaluated her for glaucoma.  She was also given a BP medication on 12/21 (previously taken off it due to electrolyte issue and BP controlled until then).  She then reported headache and urinary retention, was seen by Dr. Carolanne Grumbling on 12/23; he diagnosed a UTI, BP still elevated and he thought due to UTI.  Thursday night, she had recurrent acute onset of headache with n/v.  An RN neighbor did not think she was having a CVA but her BP was elevated.  They took her back to [facility] on 12/25.  She was fidgety with vomiting, BP was 215/87.  They gave her a migraine cocktail and BP medication; she remained fidgety and the headache persisted so she was given Toradol and then magnesium.  She was discharged while her daughter was out getting something to eat, without getting an MRI.  She returned to find her mother sitting outside after having been discharged.  She went home and slept about 5 hours and ate, seemingly ok.  She went to the bathroom about midnight and it happened acutely again; her husband got her nausea medication and a BP pill and she was able to keep that down.  She developed severe pain and was unable to function.  RN friend recommended to take her back to [facility].  At [facility], she was treated for BP and headache and spoke with neurology who recommended MRI.  Morphine helped to resolve the headache at [facility] last night.  She has had a headache behind her ears intermittently since October.  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  They have had a lot of recent stress, ?related.  She has no prior h/o  migraines.    ED Course: [facility] to [facility] transfer, per Dr. Maudie Mercury:  73 yo female with hypertension, hyperlipidemia, with recurrent headache stabbing pain behind eye, apparently seen multiple times in ED ([place]),   Pt has failed migraine cocktail.  ED discussed with Dr. Cheral Marker who said to ER apparently admit for MRI.  Review of Systems: As per HPI; otherwise review of systems reviewed and negative.   Ambulatory Status:  Ambulates without assistance  Past Medical History:  Diagnosis Date  . CKD (chronic kidney disease) stage 3, GFR 30-59 ml/min   . Hypercholesteremia   . Hypertension   . Osteoporosis     Past Surgical History:  Procedure Laterality Date  . ABDOMINAL HYSTERECTOMY    . APPENDECTOMY      Social History   Socioeconomic History  . Marital status: Married    Spouse name: Not on file  . Number of children: Not on file  . Years of education: Not on file  . Highest education level: Not on file  Occupational History  . Occupation: owns a Librarian, academic, sells metal buildings  Tobacco Use  . Smoking status: Never Smoker  . Smokeless tobacco: Never Used  Substance and Sexual Activity  . Alcohol use: Yes    Comment: rare  . Drug use: Never  . Sexual activity: Not on file  Other Topics Concern  . Not on file  Social History Narrative  . Not on file   Social Determinants of Health   Financial Resource Strain:   . Difficulty of Paying Living Expenses: Not on file  Food Insecurity:   . Worried About Charity fundraiser in the Last Year: Not on file  . Ran Out of Food in the Last Year: Not on file  Transportation Needs:   . Lack of Transportation (Medical): Not on file  . Lack of Transportation (Non-Medical): Not on file  Physical Activity:   . Days of Exercise per Week: Not on file  . Minutes of Exercise per Session: Not on file  Stress:   . Feeling of Stress : Not on file  Social Connections:   . Frequency of Communication with Friends and Family:  Not on file  . Frequency of Social Gatherings with Friends and Family: Not on file  . Attends Religious Services: Not on file  . Active Member of Clubs or Organizations: Not on file  . Attends Archivist Meetings: Not on file  . Marital Status: Not on file  Intimate Partner Violence:   . Fear of Current or Ex-Partner: Not on file  . Emotionally Abused: Not on file  . Physically Abused: Not on file  . Sexually Abused: Not on file  Allergies  Allergen Reactions  . Apple Other (See Comments)    Makes her feel bad   . Other     Chicken,milk makes her sick    Family History  Problem Relation Age of Onset  . Diabetes Father 51  . Dementia Other        siblings, multiple  . Diabetes Daughter   . Stroke Neg Hx     Prior to Admission medications   Medication Sig Start Date End Date Taking? Authorizing Provider  alendronate (FOSAMAX) 70 MG tablet Take 70 mg by mouth once a week. Take with a full glass of water on an empty stomach.    [provider]  atorvastatin (LIPITOR) 20 MG tablet Take 20 mg by mouth daily.    [provider]  gabapentin (NEURONTIN) 100 MG capsule Take 100 mg by mouth 3 (three) times daily.    [provider]  omeprazole (PRILOSEC) 20 MG capsule Take 20 mg by mouth daily.    [provider]  lisinopril-hydrochlorothiazide (ZESTORETIC) 10-12.5 MG tablet Take 1 tablet by mouth daily.  10/10/18  [provider]    Physical Exam: Vitals:   02/20/19 1421 02/20/19 1422 02/20/19 1426 02/20/19 1632  BP:    (!) 118/54  Pulse:    (!) 59  Resp: _0 Temp:    98.3 F (36.8 C)  TempSrc:    Oral  SpO2:    98%  Weight:      Height:         . General:  Appears calm and comfortable and is NAD, very conversant . Eyes:  PERRL, EOMI, normal lids, iris . ENT:  grossly normal hearing, lips & tongue, mmm . Neck:  no LAD, masses or thyromegaly . Cardiovascular:  RR with mild bradycardia, no m/r/g. No LE  edema.  Marland Kitchen Respiratory:   CTA bilaterally with no wheezes/rales/rhonchi.  Normal respiratory effort. . Abdomen:  soft, NT, ND, NABS . Back:   normal alignment, no CVAT . Skin:  no rash or induration seen on limited exam . Musculoskeletal:  grossly normal tone BUE/BLE, good ROM, no bony abnormality . Psychiatric:  grossly normal mood and affect, speech fluent and appropriate, AOx3 . Neurologic:  CN 2-12 grossly intact, moves all extremities in coordinated fashion, sensation intact    Radiological Exams on Admission: CT Head Wo Contrast  Result Date: 02/20/2019 CLINICAL DATA:  Headache EXAM: CT HEAD WITHOUT CONTRAST TECHNIQUE: Contiguous axial images were obtained from the base of the skull through the vertex without intravenous contrast. COMPARISON:  None. FINDINGS: Brain: There is no mass, hemorrhage or extra-axial collection. The size and configuration of the ventricles and extra-axial CSF spaces are normal. The brain parenchyma is normal, without acute or chronic infarction. Bilateral basal ganglia mineralization. Vascular: No abnormal hyperdensity of the major intracranial arteries or dural venous sinuses. No intracranial atherosclerosis. Skull: The visualized skull base, calvarium and extracranial soft tissues are normal. Sinuses/Orbits: No fluid levels or advanced mucosal thickening of the visualized paranasal sinuses. No mastoid or middle ear effusion. The orbits are normal. IMPRESSION: No acute intracranial abnormality. Electronically Signed   By: Ulyses Jarred M.D.   On: 02/20/2019 04:59   MR ANGIO HEAD WO CONTRAST  Result Date: 02/20/2019 CLINICAL DATA:  Headache, acute, normal neuro exam. Episode of sudden onset of sharp pain behind the right eye. Associated photophobia. EXAM: MRI HEAD WITHOUT AND WITH CONTRAST MRV HEAD WITHOUT AND WITH CONTRAST MRA HEAD WITHOUT CONTRAST TECHNIQUE: Multiplanar,  multiecho pulse sequences of the brain and surrounding structures were obtained without and  with intravenous contrast. Angiographic images of the head were obtained using MRV technique without and with contrast. Angiographic images of the head were obtained using MRA technique without contrast. CONTRAST:  55m GADAVIST GADOBUTROL 1 MMOL/ML IV SOLN COMPARISON:  CT HEAD WITHOUT CONTRAST 02/20/2019 AND 02/12/2019. FINDINGS: MRI HEAD FINDINGS Brain: No acute infarct, hemorrhage, or mass lesion is present. The ventricles are of normal size. No significant white matter lesions are present. No significant extraaxial fluid collection is present. The internal auditory canals are within normal limits. The brainstem and cerebellum are within normal limits. Postcontrast images demonstrate no pathologic enhancement. Vascular: Flow is present in the major intracranial arteries. Skull and upper cervical spine: The craniocervical junction is normal. Upper cervical spine is within normal limits. Marrow signal is unremarkable. Sinuses/Orbits: A polyp or mucous retention cyst is present at the floor of the right maxillary sinus. The paranasal sinuses and mastoid air cells are otherwise clear. Bilateral lens replacements are noted. Globes and orbits are otherwise unremarkable. MRV HEAD FINDINGS The dural sinuses are patent. Right transverse sinus is dominant. Sigmoid sinus and jugular vein is normal bilaterally. The straight sinus and deep cerebral veins are patent. Cortical veins are unremarkable. MRA HEAD FINDINGS There is mild narrowing of less than 50% in the supraclinoid right ICA. The internal carotid arteries are otherwise within normal limits from the high cervical segments through the ICA termini. There is a fenestration of the proximal right M1 segment, a normal variant. The A1 and M1 segments are otherwise normal. The anterior communicating artery is patent. MCA bifurcations are intact. ACA and MCA branch vessels demonstrate some irregularity without significant proximal stenosis or occlusion. The vertebral arteries  are codominant. Right PICA origins visualized and normal. Left AICA is dominant. Basilar artery is normal. Both posterior cerebral arteries originate from the basilar tip. There is some irregularity of distal PCA branch vessels without significant proximal stenosis or occlusion. IMPRESSION: 1. Normal MRI appearance of the brain. No acute or focal lesion to explain the patient's symptoms. 2. Normal variant MRA Circle of Willis without evidence for significant proximal stenosis, aneurysm, or branch vessel occlusion. 3. Mild distal small vessel disease is present in both the anterior and posterior circulation. 4. Normal MRV of the head without and with contrast. No focal stenosis or thrombosis. Electronically Signed   By: CSan MorelleM.D.   On: 02/20/2019 17:08   MR BRAIN W WO CONTRAST  Result Date: 02/20/2019 CLINICAL DATA:  Headache, acute, normal neuro exam. Episode of sudden onset of sharp pain behind the right eye. Associated photophobia. EXAM: MRI HEAD WITHOUT AND WITH CONTRAST MRV HEAD WITHOUT AND WITH CONTRAST MRA HEAD WITHOUT CONTRAST TECHNIQUE: Multiplanar, multiecho pulse sequences of the brain and surrounding structures were obtained without and with intravenous contrast. Angiographic images of the head were obtained using MRV technique without and with contrast. Angiographic images of the head were obtained using MRA technique without contrast. CONTRAST:  690mGADAVIST GADOBUTROL 1 MMOL/ML IV SOLN COMPARISON:  CT HEAD WITHOUT CONTRAST 02/20/2019 AND 02/12/2019. FINDINGS: MRI HEAD FINDINGS Brain: No acute infarct, hemorrhage, or mass lesion is present. The ventricles are of normal size. No significant white matter lesions are present. No significant extraaxial fluid collection is present. The internal auditory canals are within normal limits. The brainstem and cerebellum are within normal limits. Postcontrast images demonstrate no pathologic enhancement. Vascular: Flow is present in the major  intracranial arteries. Skull  and upper cervical spine: The craniocervical junction is normal. Upper cervical spine is within normal limits. Marrow signal is unremarkable. Sinuses/Orbits: A polyp or mucous retention cyst is present at the floor of the right maxillary sinus. The paranasal sinuses and mastoid air cells are otherwise clear. Bilateral lens replacements are noted. Globes and orbits are otherwise unremarkable. MRV HEAD FINDINGS The dural sinuses are patent. Right transverse sinus is dominant. Sigmoid sinus and jugular vein is normal bilaterally. The straight sinus and deep cerebral veins are patent. Cortical veins are unremarkable. MRA HEAD FINDINGS There is mild narrowing of less than 50% in the supraclinoid right ICA. The internal carotid arteries are otherwise within normal limits from the high cervical segments through the ICA termini. There is a fenestration of the proximal right M1 segment, a normal variant. The A1 and M1 segments are otherwise normal. The anterior communicating artery is patent. MCA bifurcations are intact. ACA and MCA branch vessels demonstrate some irregularity without significant proximal stenosis or occlusion. The vertebral arteries are codominant. Right PICA origins visualized and normal. Left AICA is dominant. Basilar artery is normal. Both posterior cerebral arteries originate from the basilar tip. There is some irregularity of distal PCA branch vessels without significant proximal stenosis or occlusion. IMPRESSION: 1. Normal MRI appearance of the brain. No acute or focal lesion to explain the patient's symptoms. 2. Normal variant MRA Circle of Willis without evidence for significant proximal stenosis, aneurysm, or branch vessel occlusion. 3. Mild distal small vessel disease is present in both the anterior and posterior circulation. 4. Normal MRV of the head without and with contrast. No focal stenosis or thrombosis. Electronically Signed   By: San Morelle M.D.   On:  02/20/2019 17:08   MR MRV HEAD W WO CONTRAST  Result Date: 02/20/2019 CLINICAL DATA:  Headache, acute, normal neuro exam. Episode of sudden onset of sharp pain behind the right eye. Associated photophobia. EXAM: MRI HEAD WITHOUT AND WITH CONTRAST MRV HEAD WITHOUT AND WITH CONTRAST MRA HEAD WITHOUT CONTRAST TECHNIQUE: Multiplanar, multiecho pulse sequences of the brain and surrounding structures were obtained without and with intravenous contrast. Angiographic images of the head were obtained using MRV technique without and with contrast. Angiographic images of the head were obtained using MRA technique without contrast. CONTRAST:  86m GADAVIST GADOBUTROL 1 MMOL/ML IV SOLN COMPARISON:  CT HEAD WITHOUT CONTRAST 02/20/2019 AND 02/12/2019. FINDINGS: MRI HEAD FINDINGS Brain: No acute infarct, hemorrhage, or mass lesion is present. The ventricles are of normal size. No significant white matter lesions are present. No significant extraaxial fluid collection is present. The internal auditory canals are within normal limits. The brainstem and cerebellum are within normal limits. Postcontrast images demonstrate no pathologic enhancement. Vascular: Flow is present in the major intracranial arteries. Skull and upper cervical spine: The craniocervical junction is normal. Upper cervical spine is within normal limits. Marrow signal is unremarkable. Sinuses/Orbits: A polyp or mucous retention cyst is present at the floor of the right maxillary sinus. The paranasal sinuses and mastoid air cells are otherwise clear. Bilateral lens replacements are noted. Globes and orbits are otherwise unremarkable. MRV HEAD FINDINGS The dural sinuses are patent. Right transverse sinus is dominant. Sigmoid sinus and jugular vein is normal bilaterally. The straight sinus and deep cerebral veins are patent. Cortical veins are unremarkable. MRA HEAD FINDINGS There is mild narrowing of less than 50% in the supraclinoid right ICA. The internal  carotid arteries are otherwise within normal limits from the high cervical segments through the ICA termini.  There is a fenestration of the proximal right M1 segment, a normal variant. The A1 and M1 segments are otherwise normal. The anterior communicating artery is patent. MCA bifurcations are intact. ACA and MCA branch vessels demonstrate some irregularity without significant proximal stenosis or occlusion. The vertebral arteries are codominant. Right PICA origins visualized and normal. Left AICA is dominant. Basilar artery is normal. Both posterior cerebral arteries originate from the basilar tip. There is some irregularity of distal PCA branch vessels without significant proximal stenosis or occlusion. IMPRESSION: 1. Normal MRI appearance of the brain. No acute or focal lesion to explain the patient's symptoms. 2. Normal variant MRA Circle of Willis without evidence for significant proximal stenosis, aneurysm, or branch vessel occlusion. 3. Mild distal small vessel disease is present in both the anterior and posterior circulation. 4. Normal MRV of the head without and with contrast. No focal stenosis or thrombosis. Electronically Signed   By: San Morelle M.D.   On: 02/20/2019 17:08    EKG: not done   Labs on Admission: I have personally reviewed the available labs and imaging studies at the time of the admission.  Pertinent labs:   Glucose 122 BUN 37/Creatinine 1.51/GFR 34; 33/2.13/22 on 8/15 Hgb 11.3 (improved) ESR 11 COVID pending; negative on 12/20 ([facility] only)  Assessment/Plan Principal Problem:   Headache Active Problems:   Essential hypertension   Dyslipidemia   Stage 3b chronic kidney disease   Headache -Patient is a very functional and independent 73yo without h/o significant headaches, presenting with refractory headaches over the last 1-2 weeks -Also noted to have HTN, uncertain if HA caused HTN or HTN caused HA -Based on recurrent presentations to various EDs,  neurology recommended transfer from [facility] to [facility] for MRI -Neurology consult is pending -If MRI is negative, this appears to be related to uncontrolled HTN and patient may benefit from BB therapy for headache suppression and BP control -Will monitor on telemetry overnight -Anticipate d/c to home tomorrow unless new issues arise so observation status is reasonable for now  HTN -Likely needs addition of a new medication - possibly BB given headaches -Will defer to neurology for now and likely start medication tomorrow given reasonable BP control in the hospital thus far  HLD -Continue Lipitor  Stage 3b CKD -Appears to be at/slightly better than usual baseline -Will follow  DNR -I have discussed code status with the patient and the patient would not desire resuscitation and would prefer to die a natural death should that situation arise.   Note: This patient has been tested and is negative for the novel coronavirus COVID-19 by [facility] testing; Hologic testing is pending.   DVT prophylaxis:  Lovenox  Code Status:  DNR - confirmed with patient Family Communication: I have spoken by telephone with the patient's daughter (twice)  Disposition Plan:  Home once clinically improved Consults called: Neurology  Admission status: It is my clinical opinion that referral for OBSERVATION is reasonable and necessary in this patient based on the above information provided. The aforementioned taken together are felt to place the patient at high risk for further clinical deterioration. However it is anticipated that the patient may be medically stable for discharge from the hospital within 24 to 48 hours.    Karmen Bongo MD [facility]   How to contact the [facility] Attending or Consulting provider [place] or covering provider during after hours [place], for this patient?  1. Check the care team in [facility] and look for a) attending/consulting  [place] provider listed and b) the [facility] team listed 2. Log into  [email or web address] and use [place]'s universal password to access. If you do not have the password, please contact the hospital operator. 3. Locate the [facility] provider you are looking for under [facility] and page to a number that you can be directly reached. 4. If you still have difficulty reaching the provider, please page the [facility] (Director on Call) for the Hospitalists listed on amion for assistance.   02/20/2019, 5:58 PM

## 2019-02-20 NOTE — ED Notes (Signed)
Patient transported to CT 

## 2019-02-20 NOTE — ED Triage Notes (Signed)
Pt is c/o severe headache that started about an hour ago  Pt has been having headaches for the past couple of weeks  Pt was seen here a couple of nights ago and was seen at [facility] last night for same  Pt is c/o nausea without vomiting  Pt states the pain has settled behind her right eye  Visitor states she has had a CT scan that was normal  Family is requesting a MRI  Explained that we do not offer that service here at night

## 2019-02-20 NOTE — Plan of Care (Signed)
73 yo female with hypertension, hyperlipidemia, with recurrent headache stabbing pain behind eye, apparently seen multiple times in ED ([place]),   Pt has failed migraine cocktail.  ED discussed with Dr. Cheral Marker who said to ER apparently admit for MRI  Please order MRI brain and consult neurology , f/u on ESR, thanks

## 2019-02-21 DIAGNOSIS — G4485 Primary stabbing headache: Secondary | ICD-10-CM

## 2019-02-21 DIAGNOSIS — G43901 Migraine, unspecified, not intractable, with status migrainosus: Secondary | ICD-10-CM | POA: Diagnosis not present

## 2019-02-21 DIAGNOSIS — E785 Hyperlipidemia, unspecified: Secondary | ICD-10-CM | POA: Diagnosis not present

## 2019-02-21 LAB — HEMOGLOBIN A1C
Hgb A1c MFr Bld: 5.5 % (ref 4.8–5.6)
Mean Plasma Glucose: 111.15 mg/dL

## 2019-02-21 LAB — LIPID PANEL
Cholesterol: 173 mg/dL (ref 0–200)
HDL: 42 mg/dL (ref >40)
LDL Cholesterol: 112 mg/dL — ABNORMAL HIGH (ref 0–99)
Total CHOL/HDL Ratio: 4.1 RATIO
Triglycerides: 93 mg/dL (ref <150)
VLDL: 19 mg/dL (ref 0–40)

## 2019-02-21 MED ORDER — GABAPENTIN 100 MG PO CAPS: ORAL_CAPSULE | ORAL | 0 refills | Status: AC

## 2019-02-21 MED ORDER — AMLODIPINE BESYLATE 5 MG PO TABS: 5.0000 mg | ORAL_TABLET | Freq: Every day | ORAL | 0 refills | Status: DC

## 2019-02-21 NOTE — Progress Notes (Signed)
OT Cancellation Note  Patient Details Name: Samantha Owen MRN: [id number] DOB: [birth year]   Cancelled Treatment:    Reason Eval/Treat Not Completed: OT screened, no needs identified, will sign off.  Pt with negative MRI, and per chart, has returned to baseline.   Nilsa Nutting., OTR/L Acute Rehabilitation Services Pager [phone] Office [phone]   Lucille Passy M 02/21/2019, 12:26 PM

## 2019-02-21 NOTE — Progress Notes (Signed)
NEURO HOSPITALIST PROGRESS NOTE   Subjective: Patient awake, alert, NAD, sitting in chair. Walked with PT today. Denies any HA now or overnight.  BP: 148/52 Exam: Vitals:   02/21/19 0721 02/21/19 0759  BP:  (!) 148/52  Pulse:  (!) 51  Resp: 16 14  Temp:  (!) 97.3 F (36.3 C)  SpO2:  100%    Physical Exam  Constitutional: Appears well-developed and well-nourished.  Psych: Affect appropriate to situation Eyes: Normal external eye and conjunctiva. HENT: Normocephalic, no lesions, without obvious abnormality.   Musculoskeletal-no joint tenderness, deformity or swelling Cardiovascular: Normal rate and regular rhythm.  Respiratory: Effort normal, non-labored breathing saturations WNL GI: Soft.  No distension. There is no tenderness.  Skin: WDI   Neuro:  Mental Status: Alert, oriented, thought content appropriate.  Speech fluent without evidence of aphasia.  Able to follow commands without difficulty. Cranial Nerves: II: Visual fields grossly normal,  III,IV, VI: ptosis not present, extra-ocular motions intact bilaterally pupils equal, round, reactive to light and accommodation V,VII: smile symmetric, facial light touch sensation normal bilaterally VIII: hearing normal bilaterally IX,X: uvula rises symmetrically XI: bilateral shoulder shrug XII: midline tongue extension Motor: Right : Upper extremity   5/5  Left:     Upper extremity   5/5  Lower extremity   5/5   Lower extremity   5/5 Tone and bulk:normal tone throughout; no atrophy noted Sensory: Pinprick and light touch intact throughout, bilaterally Deep Tendon Reflexes: 2+ and symmetric biceps, patella Plantars: Right: downgoing   Left: downgoing Cerebellar: No ataxia Gait:deferred    Medications:  Scheduled: . aspirin EC  81 mg Oral Daily  . atorvastatin  20 mg Oral Daily  . enoxaparin (LOVENOX) injection  40 mg Subcutaneous Q24H  . gabapentin  100 mg Oral TID  . pantoprazole  40 mg  Oral Daily   Continuous: . sodium chloride 50 mL/hr at 02/20/19 1414   MEB:[id number] **OR** acetaminophen (TYLENOL) oral liquid 160 mg/5 mL **OR** acetaminophen, morphine injection, senna-docusate  Pertinent Labs/Diagnostics:   CT Head Wo Contrast  Result Date: 02/20/2019 CLINICAL DATA:  Headache EXAM: CT HEAD WITHOUT CONTRAST TECHNIQUE: Contiguous axial images were obtained from the base of the skull through the vertex without intravenous contrast. COMPARISON:  None. FINDINGS: Brain: There is no mass, hemorrhage or extra-axial collection. The size and configuration of the ventricles and extra-axial CSF spaces are normal. The brain parenchyma is normal, without acute or chronic infarction. Bilateral basal ganglia mineralization. Vascular: No abnormal hyperdensity of the major intracranial arteries or dural venous sinuses. No intracranial atherosclerosis. Skull: The visualized skull base, calvarium and extracranial soft tissues are normal. Sinuses/Orbits: No fluid levels or advanced mucosal thickening of the visualized paranasal sinuses. No mastoid or middle ear effusion. The orbits are normal. IMPRESSION: No acute intracranial abnormality. Electronically Signed   By: Ulyses Jarred M.D.   On: 02/20/2019 04:59   MR ANGIO HEAD WO CONTRAST  Result Date: 02/20/2019 CLINICAL DATA:  Headache, acute, normal neuro exam. Episode of sudden onset of sharp pain behind the right eye. Associated photophobia. EXAM: MRI HEAD WITHOUT AND WITH CONTRAST MRV HEAD WITHOUT AND WITH CONTRAST MRA HEAD WITHOUT CONTRAST TECHNIQUE: Multiplanar, multiecho pulse sequences of the brain and surrounding structures were obtained without and with intravenous contrast. Angiographic images of the head were obtained using MRV technique without and with contrast. Angiographic images of the  head were obtained using MRA technique without contrast. CONTRAST:  66mL GADAVIST GADOBUTROL 1 MMOL/ML IV SOLN COMPARISON:  CT HEAD WITHOUT  CONTRAST 02/20/2019 AND 02/12/2019. FINDINGS: MRI HEAD FINDINGS Brain: No acute infarct, hemorrhage, or mass lesion is present. The ventricles are of normal size. No significant white matter lesions are present. No significant extraaxial fluid collection is present. The internal auditory canals are within normal limits. The brainstem and cerebellum are within normal limits. Postcontrast images demonstrate no pathologic enhancement. Vascular: Flow is present in the major intracranial arteries. Skull and upper cervical spine: The craniocervical junction is normal. Upper cervical spine is within normal limits. Marrow signal is unremarkable. Sinuses/Orbits: A polyp or mucous retention cyst is present at the floor of the right maxillary sinus. The paranasal sinuses and mastoid air cells are otherwise clear. Bilateral lens replacements are noted. Globes and orbits are otherwise unremarkable. MRV HEAD FINDINGS The dural sinuses are patent. Right transverse sinus is dominant. Sigmoid sinus and jugular vein is normal bilaterally. The straight sinus and deep cerebral veins are patent. Cortical veins are unremarkable. MRA HEAD FINDINGS There is mild narrowing of less than 50% in the supraclinoid right ICA. The internal carotid arteries are otherwise within normal limits from the high cervical segments through the ICA termini. There is a fenestration of the proximal right M1 segment, a normal variant. The A1 and M1 segments are otherwise normal. The anterior communicating artery is patent. MCA bifurcations are intact. ACA and MCA branch vessels demonstrate some irregularity without significant proximal stenosis or occlusion. The vertebral arteries are codominant. Right PICA origins visualized and normal. Left AICA is dominant. Basilar artery is normal. Both posterior cerebral arteries originate from the basilar tip. There is some irregularity of distal PCA branch vessels without significant proximal stenosis or occlusion.  IMPRESSION: 1. Normal MRI appearance of the brain. No acute or focal lesion to explain the patient's symptoms. 2. Normal variant MRA Circle of Willis without evidence for significant proximal stenosis, aneurysm, or branch vessel occlusion. 3. Mild distal small vessel disease is present in both the anterior and posterior circulation. 4. Normal MRV of the head without and with contrast. No focal stenosis or thrombosis. Electronically Signed   By: San Morelle M.D.   On: 02/20/2019 17:08   MR BRAIN W WO CONTRAST  Result Date: 02/20/2019 CLINICAL DATA:  Headache, acute, normal neuro exam. Episode of sudden onset of sharp pain behind the right eye. Associated photophobia. EXAM: MRI HEAD WITHOUT AND WITH CONTRAST MRV HEAD WITHOUT AND WITH CONTRAST MRA HEAD WITHOUT CONTRAST TECHNIQUE: Multiplanar, multiecho pulse sequences of the brain and surrounding structures were obtained without and with intravenous contrast. Angiographic images of the head were obtained using MRV technique without and with contrast. Angiographic images of the head were obtained using MRA technique without contrast. CONTRAST:  66mL GADAVIST GADOBUTROL 1 MMOL/ML IV SOLN COMPARISON:  CT HEAD WITHOUT CONTRAST 02/20/2019 AND 02/12/2019. FINDINGS: MRI HEAD FINDINGS Brain: No acute infarct, hemorrhage, or mass lesion is present. The ventricles are of normal size. No significant white matter lesions are present. No significant extraaxial fluid collection is present. The internal auditory canals are within normal limits. The brainstem and cerebellum are within normal limits. Postcontrast images demonstrate no pathologic enhancement. Vascular: Flow is present in the major intracranial arteries. Skull and upper cervical spine: The craniocervical junction is normal. Upper cervical spine is within normal limits. Marrow signal is unremarkable. Sinuses/Orbits: A polyp or mucous retention cyst is present at the floor of the right  maxillary sinus. The  paranasal sinuses and mastoid air cells are otherwise clear. Bilateral lens replacements are noted. Globes and orbits are otherwise unremarkable. MRV HEAD FINDINGS The dural sinuses are patent. Right transverse sinus is dominant. Sigmoid sinus and jugular vein is normal bilaterally. The straight sinus and deep cerebral veins are patent. Cortical veins are unremarkable. MRA HEAD FINDINGS There is mild narrowing of less than 50% in the supraclinoid right ICA. The internal carotid arteries are otherwise within normal limits from the high cervical segments through the ICA termini. There is a fenestration of the proximal right M1 segment, a normal variant. The A1 and M1 segments are otherwise normal. The anterior communicating artery is patent. MCA bifurcations are intact. ACA and MCA branch vessels demonstrate some irregularity without significant proximal stenosis or occlusion. The vertebral arteries are codominant. Right PICA origins visualized and normal. Left AICA is dominant. Basilar artery is normal. Both posterior cerebral arteries originate from the basilar tip. There is some irregularity of distal PCA branch vessels without significant proximal stenosis or occlusion. IMPRESSION: 1. Normal MRI appearance of the brain. No acute or focal lesion to explain the patient's symptoms. 2. Normal variant MRA Circle of Willis without evidence for significant proximal stenosis, aneurysm, or branch vessel occlusion. 3. Mild distal small vessel disease is present in both the anterior and posterior circulation. 4. Normal MRV of the head without and with contrast. No focal stenosis or thrombosis. Electronically Signed   By: San Morelle M.D.   On: 02/20/2019 17:08   MR MRV HEAD W WO CONTRAST  Result Date: 02/20/2019 CLINICAL DATA:  Headache, acute, normal neuro exam. Episode of sudden onset of sharp pain behind the right eye. Associated photophobia. EXAM: MRI HEAD WITHOUT AND WITH CONTRAST MRV HEAD WITHOUT AND WITH  CONTRAST MRA HEAD WITHOUT CONTRAST TECHNIQUE: Multiplanar, multiecho pulse sequences of the brain and surrounding structures were obtained without and with intravenous contrast. Angiographic images of the head were obtained using MRV technique without and with contrast. Angiographic images of the head were obtained using MRA technique without contrast. CONTRAST:  6mL GADAVIST GADOBUTROL 1 MMOL/ML IV SOLN COMPARISON:  CT HEAD WITHOUT CONTRAST 02/20/2019 AND 02/12/2019. FINDINGS: MRI HEAD FINDINGS Brain: No acute infarct, hemorrhage, or mass lesion is present. The ventricles are of normal size. No significant white matter lesions are present. No significant extraaxial fluid collection is present. The internal auditory canals are within normal limits. The brainstem and cerebellum are within normal limits. Postcontrast images demonstrate no pathologic enhancement. Vascular: Flow is present in the major intracranial arteries. Skull and upper cervical spine: The craniocervical junction is normal. Upper cervical spine is within normal limits. Marrow signal is unremarkable. Sinuses/Orbits: A polyp or mucous retention cyst is present at the floor of the right maxillary sinus. The paranasal sinuses and mastoid air cells are otherwise clear. Bilateral lens replacements are noted. Globes and orbits are otherwise unremarkable. MRV HEAD FINDINGS The dural sinuses are patent. Right transverse sinus is dominant. Sigmoid sinus and jugular vein is normal bilaterally. The straight sinus and deep cerebral veins are patent. Cortical veins are unremarkable. MRA HEAD FINDINGS There is mild narrowing of less than 50% in the supraclinoid right ICA. The internal carotid arteries are otherwise within normal limits from the high cervical segments through the ICA termini. There is a fenestration of the proximal right M1 segment, a normal variant. The A1 and M1 segments are otherwise normal. The anterior communicating artery is patent. MCA  bifurcations are intact. ACA and MCA  branch vessels demonstrate some irregularity without significant proximal stenosis or occlusion. The vertebral arteries are codominant. Right PICA origins visualized and normal. Left AICA is dominant. Basilar artery is normal. Both posterior cerebral arteries originate from the basilar tip. There is some irregularity of distal PCA branch vessels without significant proximal stenosis or occlusion. IMPRESSION: 1. Normal MRI appearance of the brain. No acute or focal lesion to explain the patient's symptoms. 2. Normal variant MRA Circle of Willis without evidence for significant proximal stenosis, aneurysm, or branch vessel occlusion. 3. Mild distal small vessel disease is present in both the anterior and posterior circulation. 4. Normal MRV of the head without and with contrast. No focal stenosis or thrombosis. Electronically Signed   By: San Morelle M.D.   On: 02/20/2019 17:08   Assessment: 73 year old female with new onset unilateral retro-orbital throbbing headache associated with photophobia and nausea/vomiting.  The semiology is very much most consistent with migraine, though the onset at this late stage of life is very unusual.  The clear semiology of migraine coupled with the low sed rate make me think that temporal arteritis is very unlikely.  The abrupt onset makes CNS vasculitis less likely, this could also be considered.  The fact that it is always unilateral, but switches sides makes orbital pathology much less likely.  Venous sinus thrombosis is also usually more of a progressive headache, but would still like to evaluate for this.  I have usually heard headache due to hypertensive emergency described as more of a holocephalic pain, or making tension type headaches rather than the migraine.  That being said, her blood pressure has been elevated with these events, and sometimes it is difficult to tell whether the pain is causing hypertension or the  hypertension is possible for the headache.  MRI was negative for anything acute or focal lesions. MRA: was normal with no occlusions or aneurysms. MRV: was normal w/o stenosis or thrombosis.  We will treat as migraine and she should follow up with outpatient neurology.     Recommendations:  --Gabapentin 100 mg TID for 1 week, then increase gabapentin to 200 mg tid --F/u outpatient neurology --Neurology to sign-off at this time please call with any further questions or concerns.  Laurey Morale, MSN, NP-C [facility] [phone]      02/21/2019, 9:35 AM

## 2019-02-21 NOTE — Evaluation (Signed)
Physical Therapy One time Evaluation Patient Details Name: Samantha Owen MRN: [id number] DOB: 03-04-1945 Today's Date: 02/21/2019   History of Present Illness  Samantha Owen is an 73 y.o. female  With PMH HTN, HLD, CKD 3 who presented to med [facility] for c/o 10 days of intermittent HA.  Clinical Impression  Pt admitted with above diagnosis. Pt was able to ambulate without device with supervision and no LOB with challenges.  Scored 22/24 on DGI suggesting low risk of falls.  VSS.  Pt does not need skilled PT at this time.  Follow Up Recommendations No PT follow up    Equipment Recommendations  None recommended by PT    Recommendations for Other Services       Precautions / Restrictions Precautions Precautions: Fall Restrictions Weight Bearing Restrictions: No      Mobility  Bed Mobility Overal bed mobility: Independent                Transfers Overall transfer level: Independent                  Ambulation/Gait Ambulation/Gait assistance: Supervision Gait Distance (Feet): 450 Feet Assistive device: None Gait Pattern/deviations: Step-through pattern;Decreased stride length   Gait velocity interpretation: <1.31 ft/sec, indicative of household ambulator General Gait Details: Pt was able to ambulate with supervision without device and can withstand moderate challenges to balance without LOB.    Stairs Stairs: Yes Stairs assistance: Min guard Stair Management: No rails;Alternating pattern;Forwards Number of Stairs: 4 General stair comments: Pt was able to ascend and descend 4 steps with min guard assist to supervision.  Pt slightly unsure of herself but no LOB.    Wheelchair Mobility    Modified Rankin (Stroke Patients Only)       Balance Overall balance assessment: Needs assistance Sitting-balance support: Feet supported;No upper extremity supported Sitting balance-Leahy Scale: Fair     Standing balance support: No upper extremity  supported;During functional activity Standing balance-Leahy Scale: Fair Standing balance comment: can withstand challenges to balance without device                 Standardized Balance Assessment Standardized Balance Assessment : Dynamic Gait Index   Dynamic Gait Index Level Surface: Normal Change in Gait Speed: Normal Gait with Horizontal Head Turns: Normal Gait with Vertical Head Turns: Normal Gait and Pivot Turn: Mild Impairment Step Over Obstacle: Normal Step Around Obstacles: Normal Steps: Mild Impairment Total Score: 22       Pertinent Vitals/Pain Pain Assessment: No/denies pain    Home Living Family/patient expects to be discharged to:: Private residence Living Arrangements: Spouse/significant other(husband) Available Help at Discharge: Family;Available 24 hours/day Type of Home: House Home Access: Stairs to enter Entrance Stairs-Rails: None Entrance Stairs-Number of Steps: 4 Home Layout: One level Home Equipment: Cane - single point;Crutches      Prior Function Level of Independence: Independent               Hand Dominance   Dominant Hand: Right    Extremity/Trunk Assessment   Upper Extremity Assessment Upper Extremity Assessment: Defer to OT evaluation    Lower Extremity Assessment Lower Extremity Assessment: Overall WFL for tasks assessed    Cervical / Trunk Assessment Cervical / Trunk Assessment: Normal  Communication   Communication: No difficulties  Cognition Arousal/Alertness: Awake/alert Behavior During Therapy: WFL for tasks assessed/performed Overall Cognitive Status: Within Functional Limits for tasks assessed  General Comments General comments (skin integrity, edema, etc.): Scored 22/24 on DGI suggesting low risk of falls.     Exercises     Assessment/Plan    PT Assessment Patent does not need any further PT services  PT Problem List         PT Treatment  Interventions      PT Goals (Current goals can be found in the Care Plan section)  Acute Rehab PT Goals Patient Stated Goal: to go home PT Goal Formulation: All assessment and education complete, DC therapy    Frequency     Barriers to discharge        Co-evaluation               AM-PAC PT "6 Clicks" Mobility  Outcome Measure Help needed turning from your back to your side while in a flat bed without using bedrails?: None Help needed moving from lying on your back to sitting on the side of a flat bed without using bedrails?: None Help needed moving to and from a bed to a chair (including a wheelchair)?: None Help needed standing up from a chair using your arms (e.g., wheelchair or bedside chair)?: None Help needed to walk in hospital room?: None Help needed climbing 3-5 steps with a railing? : A Little 6 Click Score: 23    End of Session Equipment Utilized During Treatment: Gait belt Activity Tolerance: Patient tolerated treatment well Patient left: in chair;with call bell/phone within reach Nurse Communication: Mobility status PT Visit Diagnosis: Muscle weakness (generalized) (M62.81)    Time: 2620-3559 PT Time Calculation (min) (ACUTE ONLY): 17 min   Charges:   PT Evaluation $PT Eval Low Complexity: 1 Low          Charese Abundis W,PT Acute Rehabilitation Services Pager:  [phone]  Office:  [place] 02/21/2019, 9:27 AM

## 2019-02-21 NOTE — Discharge Instructions (Signed)

## 2019-02-21 NOTE — Care Management Obs Status (Signed)
[facility] NOTIFICATION   Patient Details  Name: Samantha Owen MRN: [id number] Date of Birth: 23-Dec-1945   Medicare Observation Status Notification Given:  Yes    Carles Collet, RN 02/21/2019, 9:51 AM

## 2019-02-21 NOTE — Progress Notes (Signed)
NURSING PROGRESS NOTE  Samantha Owen [id number] Discharge Data: 02/21/2019 1:34 PM Attending Provider: Darliss Cheney, MD MBT:[facility], Amy A, NP     Samantha Owen discharged per MD order.  Discussed with the patient and pt daughter the After Visit Summary and all questions fully answered. All IV's discontinued with no bleeding noted. All belongings returned to patient for patient to take home.    Last Vital Signs:  Blood pressure (!) 173/65, pulse (!) 51, temperature 97.9 F (36.6 C), temperature source Oral, resp. rate 16, height 5\' 2"  (1.575 m), weight 66.9 kg, SpO2 100 %.  Discharge Medication List Allergies as of 02/21/2019      Reactions   Apple Other (See Comments)   Makes her feel bad    Other    Chicken,milk makes her sick      Medication List    TAKE these medications   alendronate 70 MG tablet Commonly known as: FOSAMAX Take 70 mg by mouth once a week. Take with a full glass of water on an empty stomach.   amLODipine 5 MG tablet Commonly known as: NORVASC Take 1 tablet (5 mg total) by mouth daily.   atorvastatin 20 MG tablet Commonly known as: LIPITOR Take 20 mg by mouth daily.   cholecalciferol 25 MCG (1000 UT) tablet Commonly known as: VITAMIN D3 Take 1,000 Units by mouth daily.   ciprofloxacin 250 MG tablet Commonly known as: CIPRO Take 250 mg by mouth 2 (two) times daily.   FUSION PO Take 1 capsule by mouth daily.   gabapentin 100 MG capsule Commonly known as: NEURONTIN Take 1 tablets 3 times daily for 1 week and then take 2 tablets 3 times daily after that. What changed:   how much to take  how to take this  when to take this  additional instructions   LAXATIVE PO Take 1 tablet by mouth daily.   metoCLOPramide 10 MG tablet Commonly known as: REGLAN Take 10 mg by mouth every 6 (six) hours as needed for nausea/vomiting.   omeprazole 40 MG capsule Commonly known as: PRILOSEC Take 40 mg by mouth daily.   ondansetron 4 MG  tablet Commonly known as: ZOFRAN Take 4 mg by mouth 3 (three) times daily as needed for nausea/vomiting.   PROBIOTIC PO Take 1 tablet by mouth daily.

## 2019-02-21 NOTE — Discharge Summary (Signed)
Physician Discharge Summary  Samantha Owen TJQ:[id number] DOB: 05-02-1945 DOA: 02/20/2019  PCP: Lowella Dandy, NP  Admit date: 02/20/2019 Discharge date: 02/21/2019  Admitted From: Home Disposition: Home  Recommendations for Outpatient Follow-up:  1. Follow up with PCP in 1-2 weeks 2. Please obtain BMP/CBC in one week 3. Please follow up on the following pending results:  Home Health: None Equipment/Devices: None  Discharge Condition: Stable CODE STATUS: DNR Diet recommendation: Cardiac/low-sodium  Subjective: Seen and examined.  She has no complaints.  No more headache or any vision issues.   HPI: Samantha Owen is a 73 y.o. female with medical history significant of HTN; Stage 3-4 CKD; and HLD presenting to [facility] with severe, refractory headache.  She does not tend to have headaches.  She was at a restaurant and she developed headache with pain behind her left eyeball.  She developed n/v that night with ongoing pain.  That was on Wednesday, week before last.  The following day, she went to the ER and she was discharged.  It happened again the next afternoon.  Then she developed pain behind her right eye.  She has been back and forth to the ER 3-4 times.  She woke up last night to go to the bathroom and she again had severe pain behind her R eye.  When her BP gets very high, she also develops pain in the top of her head.  No diplopia or blurry vision.  Intermittent dysphagia, rare.  She had stuttering at the onset of symptoms but none since.Last night, her left hand went numb. She did go to an eye doctor and he didn't see anything unusual, per her report.  She does have skin lesions on the top of her head that she says have been treated by dermatology - possibly last in October.  The pain on the top of her head may be associated with this.  She also reports anorexia and 8 pounds unintentional weight loss in maybe the last 4 weeks.    I spoke with her daughter.  She has a speech impediment  and they are concerned that no one is understanding her.  Family was concerned that BP was setting of headaches and now are thinking the headaches are setting off her headaches.  Her daughter reports they thought it was a stroke the first time, which was last Friday (12/18) at [facility] - BP was 195/-.  Headache recurred severely on Sunday night, and they took her to [facility] (12/20). She was given migraine medication with some improvement and then discharged again.  She went to eye doctor on Monday (12/21, Dr. Owens Shark in [place]) and he evaluated her for glaucoma.  She was also given a BP medication on 12/21 (previously taken off it due to electrolyte issue and BP controlled until then).  She then reported headache and urinary retention, was seen by Dr. Carolanne Grumbling on 12/23; he diagnosed a UTI, BP still elevated and he thought due to UTI.  Thursday night, she had recurrent acute onset of headache with n/v.  An RN neighbor did not think she was having a CVA but her BP was elevated.  They took her back to [facility] on 12/25.  She was fidgety with vomiting, BP was 215/87.  They gave her a migraine cocktail and BP medication; she remained fidgety and the headache persisted so she was given Toradol and then magnesium.  She was discharged while her daughter was out getting something to eat, without getting an MRI.  She returned  to find her mother sitting outside after having been discharged.  She went home and slept about 5 hours and ate, seemingly ok.  She went to the bathroom about midnight and it happened acutely again; her husband got her nausea medication and a BP pill and she was able to keep that down.  She developed severe pain and was unable to function.  RN friend recommended to take her back to [facility].  At [facility], she was treated for BP and headache and spoke with neurology who recommended MRI.  Morphine helped to resolve the headache at [facility] last night.  She has had a headache behind her ears intermittently since October.  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  They have had a lot of recent stress, ?related.  She has no prior h/o migraines.    ED Course: [facility] to [facility] transfer, per Dr. Maudie Mercury:  73 yo female with hypertension, hyperlipidemia, with recurrent headache stabbing pain behind eye, apparently seen multiple times in ED ([place]), Pt has failed migraine cocktail. ED discussed with Dr. Cheral Marker who said to ER apparently admit for MRI.  Brief/Interim Summary: Patient was admitted for further work-up of stroke per neurology recommendation.  Event MRI brain, MR angiogram of the head and MRV of the head and all of those studies were unremarkable.  Patient symptoms have resolved.  She came in with elevated blood pressure and she is not on any antihypertensives.  She was not given any either and her blood pressure has almost normalized.  Her symptoms could have been a consequence of uncontrolled hypertension.  Neurology has cleared the patient to discharge home with gabapentin 100 mg 3 times daily for 1 week and then increase to 20 mg 3 times daily.  Now that she is stable and has been cleared to ambulate to discharge her home and she is in stable condition.  I have prescribed her 5 mg of amlodipine p.o. daily for hypertension.  Discharge Diagnoses:  Principal Problem:   Headache Active Problems:   Essential hypertension   Dyslipidemia   Stage 3b chronic kidney disease   Status migrainosus    Discharge Instructions  Discharge Instructions    Discharge patient   Complete by: As directed    Discharge disposition: 01-Home or Self Care   Discharge patient date: 02/21/2019     Allergies as of 02/21/2019      Reactions   Apple Other (See Comments)   Makes her  feel bad    Other    Chicken,milk makes her sick      Medication List    TAKE these medications   alendronate 70 MG tablet Commonly known as: FOSAMAX Take 70 mg by mouth once a week. Take with a full glass of water on an empty stomach.   amLODipine 5 MG tablet Commonly known as: NORVASC Take 1 tablet (5 mg total) by mouth daily.   atorvastatin 20 MG tablet Commonly known as: LIPITOR Take 20 mg by mouth daily.   cholecalciferol 25 MCG (1000 UT) tablet Commonly known as: VITAMIN D3 Take 1,000 Units by mouth daily.   ciprofloxacin 250 MG tablet Commonly known as: CIPRO Take 250 mg by mouth 2 (two)  times daily.   FUSION PO Take 1 capsule by mouth daily.   gabapentin 100 MG capsule Commonly known as: NEURONTIN Take 1 tablets 3 times daily for 1 week and then take 2 tablets 3 times daily after that. What changed:   how much to take  how to take this  when to take this  additional instructions   LAXATIVE PO Take 1 tablet by mouth daily.   metoCLOPramide 10 MG tablet Commonly known as: REGLAN Take 10 mg by mouth every 6 (six) hours as needed for nausea/vomiting.   omeprazole 40 MG capsule Commonly known as: PRILOSEC Take 40 mg by mouth daily.   ondansetron 4 MG tablet Commonly known as: ZOFRAN Take 4 mg by mouth 3 (three) times daily as needed for nausea/vomiting.   PROBIOTIC PO Take 1 tablet by mouth daily.      Follow-up Information    [facility] Follow up in 2 week(s).   Specialty: Neurology Why: for headaches Contact information: [place] [phone]       Manson Allan, [place], NP Follow up in 1 week(s).   Specialty: Internal Medicine Contact information: [place] [id number] [phone]          Allergies  Allergen Reactions  . Apple Other (See Comments)    Makes her feel bad   . Other     Chicken,milk makes her sick    Consultations:  Neurology   Procedures/Studies: CT Head Wo Contrast  Result Date: 02/20/2019 CLINICAL DATA:  Headache EXAM: CT HEAD WITHOUT CONTRAST TECHNIQUE: Contiguous axial images were obtained from the base of the skull through the vertex without intravenous contrast. COMPARISON:  None. FINDINGS: Brain: There is no mass, hemorrhage or extra-axial collection. The size and configuration of the ventricles and extra-axial CSF spaces are normal. The brain parenchyma is normal, without acute or chronic infarction. Bilateral basal ganglia mineralization. Vascular: No abnormal hyperdensity of the major intracranial arteries or dural venous sinuses. No intracranial atherosclerosis. Skull: The visualized skull base, calvarium and extracranial soft tissues are normal. Sinuses/Orbits: No fluid levels or advanced mucosal thickening of the visualized paranasal sinuses. No mastoid or middle ear effusion. The orbits are normal. IMPRESSION: No acute intracranial abnormality. Electronically Signed   By: Ulyses Jarred M.D.   On: 02/20/2019 04:59   MR ANGIO HEAD WO CONTRAST  Result Date: 02/20/2019 CLINICAL DATA:  Headache, acute, normal neuro exam. Episode of sudden onset of sharp pain behind the right eye. Associated photophobia. EXAM: MRI HEAD WITHOUT AND WITH CONTRAST MRV HEAD WITHOUT AND WITH CONTRAST MRA HEAD WITHOUT CONTRAST TECHNIQUE: Multiplanar, multiecho pulse sequences of the brain and surrounding structures were obtained without and with intravenous contrast. Angiographic images of the head were obtained using MRV technique without and with contrast. Angiographic images of the head were obtained using MRA technique without contrast. CONTRAST:  33m GADAVIST GADOBUTROL 1 MMOL/ML IV SOLN COMPARISON:  CT HEAD WITHOUT CONTRAST 02/20/2019 AND 02/12/2019. FINDINGS: MRI HEAD FINDINGS Brain: No acute infarct, hemorrhage, or mass lesion is present. The ventricles are of normal size. No significant white matter lesions are present.  No significant extraaxial fluid collection is present. The internal auditory canals are within normal limits. The brainstem and cerebellum are within normal limits. Postcontrast images demonstrate no pathologic enhancement. Vascular: Flow is present in the major intracranial arteries. Skull and upper cervical spine: The craniocervical junction is normal. Upper cervical spine is within normal limits. Marrow  signal is unremarkable. Sinuses/Orbits: A polyp or mucous retention cyst is present at the floor of the right maxillary sinus. The paranasal sinuses and mastoid air cells are otherwise clear. Bilateral lens replacements are noted. Globes and orbits are otherwise unremarkable. MRV HEAD FINDINGS The dural sinuses are patent. Right transverse sinus is dominant. Sigmoid sinus and jugular vein is normal bilaterally. The straight sinus and deep cerebral veins are patent. Cortical veins are unremarkable. MRA HEAD FINDINGS There is mild narrowing of less than 50% in the supraclinoid right ICA. The internal carotid arteries are otherwise within normal limits from the high cervical segments through the ICA termini. There is a fenestration of the proximal right M1 segment, a normal variant. The A1 and M1 segments are otherwise normal. The anterior communicating artery is patent. MCA bifurcations are intact. ACA and MCA branch vessels demonstrate some irregularity without significant proximal stenosis or occlusion. The vertebral arteries are codominant. Right PICA origins visualized and normal. Left AICA is dominant. Basilar artery is normal. Both posterior cerebral arteries originate from the basilar tip. There is some irregularity of distal PCA branch vessels without significant proximal stenosis or occlusion. IMPRESSION: 1. Normal MRI appearance of the brain. No acute or focal lesion to explain the patient's symptoms. 2. Normal variant MRA Circle of Willis without evidence for significant proximal stenosis, aneurysm, or  branch vessel occlusion. 3. Mild distal small vessel disease is present in both the anterior and posterior circulation. 4. Normal MRV of the head without and with contrast. No focal stenosis or thrombosis. Electronically Signed   By: San Morelle M.D.   On: 02/20/2019 17:08   MR BRAIN W WO CONTRAST  Result Date: 02/20/2019 CLINICAL DATA:  Headache, acute, normal neuro exam. Episode of sudden onset of sharp pain behind the right eye. Associated photophobia. EXAM: MRI HEAD WITHOUT AND WITH CONTRAST MRV HEAD WITHOUT AND WITH CONTRAST MRA HEAD WITHOUT CONTRAST TECHNIQUE: Multiplanar, multiecho pulse sequences of the brain and surrounding structures were obtained without and with intravenous contrast. Angiographic images of the head were obtained using MRV technique without and with contrast. Angiographic images of the head were obtained using MRA technique without contrast. CONTRAST:  19m GADAVIST GADOBUTROL 1 MMOL/ML IV SOLN COMPARISON:  CT HEAD WITHOUT CONTRAST 02/20/2019 AND 02/12/2019. FINDINGS: MRI HEAD FINDINGS Brain: No acute infarct, hemorrhage, or mass lesion is present. The ventricles are of normal size. No significant white matter lesions are present. No significant extraaxial fluid collection is present. The internal auditory canals are within normal limits. The brainstem and cerebellum are within normal limits. Postcontrast images demonstrate no pathologic enhancement. Vascular: Flow is present in the major intracranial arteries. Skull and upper cervical spine: The craniocervical junction is normal. Upper cervical spine is within normal limits. Marrow signal is unremarkable. Sinuses/Orbits: A polyp or mucous retention cyst is present at the floor of the right maxillary sinus. The paranasal sinuses and mastoid air cells are otherwise clear. Bilateral lens replacements are noted. Globes and orbits are otherwise unremarkable. MRV HEAD FINDINGS The dural sinuses are patent. Right transverse sinus  is dominant. Sigmoid sinus and jugular vein is normal bilaterally. The straight sinus and deep cerebral veins are patent. Cortical veins are unremarkable. MRA HEAD FINDINGS There is mild narrowing of less than 50% in the supraclinoid right ICA. The internal carotid arteries are otherwise within normal limits from the high cervical segments through the ICA termini. There is a fenestration of the proximal right M1 segment, a normal variant. The A1 and M1 segments  are otherwise normal. The anterior communicating artery is patent. MCA bifurcations are intact. ACA and MCA branch vessels demonstrate some irregularity without significant proximal stenosis or occlusion. The vertebral arteries are codominant. Right PICA origins visualized and normal. Left AICA is dominant. Basilar artery is normal. Both posterior cerebral arteries originate from the basilar tip. There is some irregularity of distal PCA branch vessels without significant proximal stenosis or occlusion. IMPRESSION: 1. Normal MRI appearance of the brain. No acute or focal lesion to explain the patient's symptoms. 2. Normal variant MRA Circle of Willis without evidence for significant proximal stenosis, aneurysm, or branch vessel occlusion. 3. Mild distal small vessel disease is present in both the anterior and posterior circulation. 4. Normal MRV of the head without and with contrast. No focal stenosis or thrombosis. Electronically Signed   By: San Morelle M.D.   On: 02/20/2019 17:08   MR MRV HEAD W WO CONTRAST  Result Date: 02/20/2019 CLINICAL DATA:  Headache, acute, normal neuro exam. Episode of sudden onset of sharp pain behind the right eye. Associated photophobia. EXAM: MRI HEAD WITHOUT AND WITH CONTRAST MRV HEAD WITHOUT AND WITH CONTRAST MRA HEAD WITHOUT CONTRAST TECHNIQUE: Multiplanar, multiecho pulse sequences of the brain and surrounding structures were obtained without and with intravenous contrast. Angiographic images of the head were  obtained using MRV technique without and with contrast. Angiographic images of the head were obtained using MRA technique without contrast. CONTRAST:  6m GADAVIST GADOBUTROL 1 MMOL/ML IV SOLN COMPARISON:  CT HEAD WITHOUT CONTRAST 02/20/2019 AND 02/12/2019. FINDINGS: MRI HEAD FINDINGS Brain: No acute infarct, hemorrhage, or mass lesion is present. The ventricles are of normal size. No significant white matter lesions are present. No significant extraaxial fluid collection is present. The internal auditory canals are within normal limits. The brainstem and cerebellum are within normal limits. Postcontrast images demonstrate no pathologic enhancement. Vascular: Flow is present in the major intracranial arteries. Skull and upper cervical spine: The craniocervical junction is normal. Upper cervical spine is within normal limits. Marrow signal is unremarkable. Sinuses/Orbits: A polyp or mucous retention cyst is present at the floor of the right maxillary sinus. The paranasal sinuses and mastoid air cells are otherwise clear. Bilateral lens replacements are noted. Globes and orbits are otherwise unremarkable. MRV HEAD FINDINGS The dural sinuses are patent. Right transverse sinus is dominant. Sigmoid sinus and jugular vein is normal bilaterally. The straight sinus and deep cerebral veins are patent. Cortical veins are unremarkable. MRA HEAD FINDINGS There is mild narrowing of less than 50% in the supraclinoid right ICA. The internal carotid arteries are otherwise within normal limits from the high cervical segments through the ICA termini. There is a fenestration of the proximal right M1 segment, a normal variant. The A1 and M1 segments are otherwise normal. The anterior communicating artery is patent. MCA bifurcations are intact. ACA and MCA branch vessels demonstrate some irregularity without significant proximal stenosis or occlusion. The vertebral arteries are codominant. Right PICA origins visualized and normal. Left  AICA is dominant. Basilar artery is normal. Both posterior cerebral arteries originate from the basilar tip. There is some irregularity of distal PCA branch vessels without significant proximal stenosis or occlusion. IMPRESSION: 1. Normal MRI appearance of the brain. No acute or focal lesion to explain the patient's symptoms. 2. Normal variant MRA Circle of Willis without evidence for significant proximal stenosis, aneurysm, or branch vessel occlusion. 3. Mild distal small vessel disease is present in both the anterior and posterior circulation. 4. Normal MRV of the  head without and with contrast. No focal stenosis or thrombosis. Electronically Signed   By: San Morelle M.D.   On: 02/20/2019 17:08      Discharge Exam: Vitals:   02/21/19 1107 02/21/19 1108  BP:    Pulse:    Resp: 19 18  Temp:    SpO2:     Vitals:   02/21/19 0721 02/21/19 0759 02/21/19 1107 02/21/19 1108  BP:  (!) 148/52    Pulse:  (!) 51    Resp: _0 Temp:  (!) 97.3 F (36.3 C)    TempSrc:  Oral    SpO2:  100%    Weight:      Height:        General: Pt is alert, awake, not in acute distress Cardiovascular: RRR, S1/S2 +, no rubs, no gallops Respiratory: CTA bilaterally, no wheezing, no rhonchi Abdominal: Soft, NT, ND, bowel sounds + Extremities: no edema, no cyanosis    The results of significant diagnostics from this hospitalization (including imaging, microbiology, ancillary and laboratory) are listed below for reference.     Microbiology: Recent Results (from the past 240 hour(s))  SARS Coronavirus 2 Ag (30 min TAT) - Nasal Swab (BD Veritor Kit)     Status: None   Collection Time: 02/14/19  5:38 PM   Specimen: Nasal Swab (BD Veritor Kit)  Result Value Ref Range Status   SARS Coronavirus 2 Ag NEGATIVE NEGATIVE Final    Comment: (NOTE) SARS-CoV-2 antigen NOT DETECTED.  Negative results are presumptive.  Negative results do not preclude SARS-CoV-2 infection and should not be used as the  sole basis for treatment or other patient management decisions, including infection  control decisions, particularly in the presence of clinical signs and  symptoms consistent with COVID-19, or in those who have been in contact with the virus.  Negative results must be combined with clinical observations, patient history, and epidemiological information. The expected result is Negative. Fact Sheet for Patients: PodPark.tn Fact Sheet for Healthcare Providers: GiftContent.is This test is not yet approved or cleared by the [place] FDA and  has been authorized for detection and/or diagnosis of SARS-CoV-2 by FDA under an Emergency Use Authorization (EUA).  This EUA will remain in effect (meaning this test can be used) for the duration of  the COVID-19 de claration under Section 564(b)(1) of the Act, 21 U.S.C. section 360bbb-3(b)(1), unless the authorization is terminated or revoked sooner. Performed at [facility], [address] [place], [place] [address]   SARS CORONAVIRUS 2 (TAT 6-24 HRS) Nasopharyngeal Nasopharyngeal Swab     Status: None   Collection Time: 02/20/19  5:25 AM   Specimen: Nasopharyngeal Swab  Result Value Ref Range Status   SARS Coronavirus 2 NEGATIVE NEGATIVE Final    Comment: (NOTE) SARS-CoV-2 target nucleic acids are NOT DETECTED. The SARS-CoV-2 RNA is generally detectable in upper and lower respiratory specimens during the acute phase of infection. Negative results do not preclude SARS-CoV-2 infection, do not rule out co-infections with other pathogens, and should not be used as the sole basis for treatment or other patient management decisions. Negative results must be combined with clinical observations, patient history, and epidemiological information. The expected result is Negative. Fact Sheet for Patients: SugarRoll.be Fact Sheet for Healthcare  Providers: https://www.woods-mathews.com/ This test is not yet approved or cleared by the [place] FDA and  has been authorized for detection and/or diagnosis of SARS-CoV-2 by FDA under an Emergency Use Authorization (EUA).  This EUA will remain  in effect (meaning this test can be used) for the duration of the COVID-19 declaration under Section 56 4(b)(1) of the Act, 21 U.S.C. section 360bbb-3(b)(1), unless the authorization is terminated or revoked sooner. Performed at [facility], [place] [address] [place], [place] [address]      Labs: BNP (last 3 results) No results for input(s): BNP in the last 8760 hours. Basic Metabolic Panel: Recent Labs  Lab 02/20/19 0357  NA 138  K 4.2  CL 105  CO2 24  GLUCOSE 122*  BUN 37*  CREATININE 1.51*  CALCIUM 9.6   Liver Function Tests: No results for input(s): AST, ALT, ALKPHOS, BILITOT, PROT, ALBUMIN in the last 168 hours. No results for input(s): LIPASE, AMYLASE in the last 168 hours. No results for input(s): AMMONIA in the last 168 hours. CBC: Recent Labs  Lab 02/20/19 0357  WBC 8.7  NEUTROABS 5.7  HGB 11.3*  HCT 34.1*  MCV 95.0  PLT 340   Cardiac Enzymes: No results for input(s): CKTOTAL, CKMB, CKMBINDEX, TROPONINI in the last 168 hours. BNP: Invalid input(s): POCBNP CBG: No results for input(s): GLUCAP in the last 168 hours. D-Dimer No results for input(s): DDIMER in the last 72 hours. Hgb A1c Recent Labs    02/21/19 0414  HGBA1C 5.5   Lipid Profile Recent Labs    02/21/19 0414  CHOL 173  HDL 42  LDLCALC 112*  TRIG 93  CHOLHDL 4.1   Thyroid function studies No results for input(s): TSH, T4TOTAL, T3FREE, THYROIDAB in the last 72 hours.  Invalid input(s): FREET3 Anemia work up No results for input(s): VITAMINB12, FOLATE, FERRITIN, TIBC, IRON, RETICCTPCT in the last 72 hours. Urinalysis No results found for: COLORURINE, APPEARANCEUR, [place], [place], [place], [place], [place],  [place], PROTEINUR, UROBILINOGEN, NITRITE, LEUKOCYTESUR Sepsis Labs Invalid input(s): PROCALCITONIN,  WBC,  LACTICIDVEN Microbiology Recent Results (from the past 240 hour(s))  SARS Coronavirus 2 Ag (30 min TAT) - Nasal Swab (BD Veritor Kit)     Status: None   Collection Time: 02/14/19  5:38 PM   Specimen: Nasal Swab (BD Veritor Kit)  Result Value Ref Range Status   SARS Coronavirus 2 Ag NEGATIVE NEGATIVE Final    Comment: (NOTE) SARS-CoV-2 antigen NOT DETECTED.  Negative results are presumptive.  Negative results do not preclude SARS-CoV-2 infection and should not be used as the sole basis for treatment or other patient management decisions, including infection  control decisions, particularly in the presence of clinical signs and  symptoms consistent with COVID-19, or in those who have been in contact with the virus.  Negative results must be combined with clinical observations, patient history, and epidemiological information. The expected result is Negative. Fact Sheet for Patients: PodPark.tn Fact Sheet for Healthcare Providers: GiftContent.is This test is not yet approved or cleared by the [place] FDA and  has been authorized for detection and/or diagnosis of SARS-CoV-2 by FDA under an Emergency Use Authorization (EUA).  This EUA will remain in effect (meaning this test can be used) for the duration of  the COVID-19 de claration under Section 564(b)(1) of the Act, 21 U.S.C. section 360bbb-3(b)(1), unless the authorization is terminated or revoked sooner. Performed at [facility], [address] [place], [place] [address]   SARS CORONAVIRUS 2 (TAT 6-24 HRS) Nasopharyngeal Nasopharyngeal Swab     Status: None   Collection Time: 02/20/19  5:25 AM   Specimen: Nasopharyngeal Swab  Result Value Ref Range Status   SARS  Coronavirus 2 NEGATIVE NEGATIVE Final    Comment: (NOTE) SARS-CoV-2 target nucleic  acids are NOT DETECTED. The SARS-CoV-2 RNA is generally detectable in upper and lower respiratory specimens during the acute phase of infection. Negative results do not preclude SARS-CoV-2 infection, do not rule out co-infections with other pathogens, and should not be used as the sole basis for treatment or other patient management decisions. Negative results must be combined with clinical observations, patient history, and epidemiological information. The expected result is Negative. Fact Sheet for Patients: SugarRoll.be Fact Sheet for Healthcare Providers: https://www.woods-mathews.com/ This test is not yet approved or cleared by the [place] FDA and  has been authorized for detection and/or diagnosis of SARS-CoV-2 by FDA under an Emergency Use Authorization (EUA). This EUA will remain  in effect (meaning this test can be used) for the duration of the COVID-19 declaration under Section 56 4(b)(1) of the Act, 21 U.S.C. section 360bbb-3(b)(1), unless the authorization is terminated or revoked sooner. Performed at [facility], [address], [place], [place] [address]      Time coordinating discharge: Over 30 minutes  SIGNED:   Darliss Cheney, MD  [facility] 02/21/2019, 11:21 AM  If 7PM-7AM, please contact night-coverage [email or web address] Password [email or web address]

## 2019-03-01 DIAGNOSIS — Z79899 Other long term (current) drug therapy: Secondary | ICD-10-CM | POA: Diagnosis not present

## 2019-03-01 DIAGNOSIS — N289 Disorder of kidney and ureter, unspecified: Secondary | ICD-10-CM | POA: Diagnosis not present

## 2019-03-01 DIAGNOSIS — D539 Nutritional anemia, unspecified: Secondary | ICD-10-CM | POA: Diagnosis not present

## 2019-03-01 DIAGNOSIS — R519 Headache, unspecified: Secondary | ICD-10-CM | POA: Diagnosis not present

## 2019-03-01 DIAGNOSIS — H5712 Ocular pain, left eye: Secondary | ICD-10-CM | POA: Diagnosis not present

## 2019-03-01 DIAGNOSIS — M810 Age-related osteoporosis without current pathological fracture: Secondary | ICD-10-CM | POA: Diagnosis not present

## 2019-03-01 DIAGNOSIS — E785 Hyperlipidemia, unspecified: Secondary | ICD-10-CM | POA: Diagnosis not present

## 2019-03-01 DIAGNOSIS — K5900 Constipation, unspecified: Secondary | ICD-10-CM | POA: Diagnosis not present

## 2019-03-01 DIAGNOSIS — I10 Essential (primary) hypertension: Secondary | ICD-10-CM | POA: Diagnosis not present

## 2019-03-15 DIAGNOSIS — N289 Disorder of kidney and ureter, unspecified: Secondary | ICD-10-CM | POA: Diagnosis not present

## 2019-03-15 DIAGNOSIS — Z6825 Body mass index (BMI) 25.0-25.9, adult: Secondary | ICD-10-CM | POA: Diagnosis not present

## 2019-03-15 DIAGNOSIS — R519 Headache, unspecified: Secondary | ICD-10-CM | POA: Diagnosis not present

## 2019-03-15 DIAGNOSIS — R1084 Generalized abdominal pain: Secondary | ICD-10-CM | POA: Diagnosis not present

## 2019-03-15 DIAGNOSIS — R928 Other abnormal and inconclusive findings on diagnostic imaging of breast: Secondary | ICD-10-CM | POA: Diagnosis not present

## 2019-03-15 DIAGNOSIS — R921 Mammographic calcification found on diagnostic imaging of breast: Secondary | ICD-10-CM | POA: Diagnosis not present

## 2019-03-15 DIAGNOSIS — I10 Essential (primary) hypertension: Secondary | ICD-10-CM | POA: Diagnosis not present

## 2019-03-15 DIAGNOSIS — K5900 Constipation, unspecified: Secondary | ICD-10-CM | POA: Diagnosis not present

## 2019-03-24 ENCOUNTER — Other Ambulatory Visit: Payer: Self-pay | Admitting: Internal Medicine

## 2019-03-24 DIAGNOSIS — R921 Mammographic calcification found on diagnostic imaging of breast: Secondary | ICD-10-CM

## 2019-03-29 DIAGNOSIS — Z6825 Body mass index (BMI) 25.0-25.9, adult: Secondary | ICD-10-CM | POA: Diagnosis not present

## 2019-03-29 DIAGNOSIS — N289 Disorder of kidney and ureter, unspecified: Secondary | ICD-10-CM | POA: Diagnosis not present

## 2019-03-29 DIAGNOSIS — K5900 Constipation, unspecified: Secondary | ICD-10-CM | POA: Diagnosis not present

## 2019-03-29 DIAGNOSIS — I10 Essential (primary) hypertension: Secondary | ICD-10-CM | POA: Diagnosis not present

## 2019-03-29 DIAGNOSIS — R300 Dysuria: Secondary | ICD-10-CM | POA: Diagnosis not present

## 2019-04-02 ENCOUNTER — Ambulatory Visit
Admission: RE | Admit: 2019-04-02 | Discharge: 2019-04-02 | Disposition: A | Discharge status: Home / Self Care | Payer: Medicare HMO | Source: Ambulatory Visit | Attending: Internal Medicine | Admitting: Internal Medicine

## 2019-04-02 ENCOUNTER — Other Ambulatory Visit: Payer: Self-pay | Admitting: Internal Medicine

## 2019-04-02 ENCOUNTER — Other Ambulatory Visit: Payer: Self-pay

## 2019-04-02 DIAGNOSIS — R921 Mammographic calcification found on diagnostic imaging of breast: Secondary | ICD-10-CM

## 2019-04-02 DIAGNOSIS — N6489 Other specified disorders of breast: Secondary | ICD-10-CM | POA: Diagnosis not present

## 2019-04-02 DIAGNOSIS — R928 Other abnormal and inconclusive findings on diagnostic imaging of breast: Secondary | ICD-10-CM | POA: Diagnosis not present

## 2019-04-06 DIAGNOSIS — K5730 Diverticulosis of large intestine without perforation or abscess without bleeding: Secondary | ICD-10-CM | POA: Diagnosis not present

## 2019-04-06 DIAGNOSIS — K5900 Constipation, unspecified: Secondary | ICD-10-CM | POA: Diagnosis not present

## 2019-04-06 DIAGNOSIS — R1084 Generalized abdominal pain: Secondary | ICD-10-CM | POA: Diagnosis not present

## 2019-04-19 DIAGNOSIS — N289 Disorder of kidney and ureter, unspecified: Secondary | ICD-10-CM | POA: Diagnosis not present

## 2019-04-19 DIAGNOSIS — E785 Hyperlipidemia, unspecified: Secondary | ICD-10-CM | POA: Diagnosis not present

## 2019-04-19 DIAGNOSIS — D539 Nutritional anemia, unspecified: Secondary | ICD-10-CM | POA: Diagnosis not present

## 2019-04-19 DIAGNOSIS — R1084 Generalized abdominal pain: Secondary | ICD-10-CM | POA: Diagnosis not present

## 2019-04-19 DIAGNOSIS — I10 Essential (primary) hypertension: Secondary | ICD-10-CM | POA: Diagnosis not present

## 2019-04-19 DIAGNOSIS — R519 Headache, unspecified: Secondary | ICD-10-CM | POA: Diagnosis not present

## 2019-04-19 DIAGNOSIS — Z6825 Body mass index (BMI) 25.0-25.9, adult: Secondary | ICD-10-CM | POA: Diagnosis not present

## 2019-04-20 DIAGNOSIS — C44529 Squamous cell carcinoma of skin of other part of trunk: Secondary | ICD-10-CM | POA: Diagnosis not present

## 2019-04-20 DIAGNOSIS — L814 Other melanin hyperpigmentation: Secondary | ICD-10-CM | POA: Diagnosis not present

## 2019-04-20 DIAGNOSIS — C4442 Squamous cell carcinoma of skin of scalp and neck: Secondary | ICD-10-CM | POA: Diagnosis not present

## 2019-04-20 DIAGNOSIS — L578 Other skin changes due to chronic exposure to nonionizing radiation: Secondary | ICD-10-CM | POA: Diagnosis not present

## 2019-04-20 DIAGNOSIS — C44629 Squamous cell carcinoma of skin of left upper limb, including shoulder: Secondary | ICD-10-CM | POA: Diagnosis not present

## 2019-05-05 DIAGNOSIS — H40013 Open angle with borderline findings, low risk, bilateral: Secondary | ICD-10-CM | POA: Diagnosis not present

## 2019-05-05 DIAGNOSIS — H524 Presbyopia: Secondary | ICD-10-CM | POA: Diagnosis not present

## 2019-05-05 DIAGNOSIS — H43393 Other vitreous opacities, bilateral: Secondary | ICD-10-CM | POA: Diagnosis not present

## 2019-05-05 DIAGNOSIS — H53002 Unspecified amblyopia, left eye: Secondary | ICD-10-CM | POA: Diagnosis not present

## 2019-05-05 DIAGNOSIS — H18593 Other hereditary corneal dystrophies, bilateral: Secondary | ICD-10-CM | POA: Diagnosis not present

## 2019-06-10 DIAGNOSIS — S92352A Displaced fracture of fifth metatarsal bone, left foot, initial encounter for closed fracture: Secondary | ICD-10-CM | POA: Diagnosis not present

## 2019-06-16 DIAGNOSIS — S62307A Unspecified fracture of fifth metacarpal bone, left hand, initial encounter for closed fracture: Secondary | ICD-10-CM | POA: Diagnosis not present

## 2019-06-16 DIAGNOSIS — S92355A Nondisplaced fracture of fifth metatarsal bone, left foot, initial encounter for closed fracture: Secondary | ICD-10-CM | POA: Diagnosis not present

## 2019-06-22 DIAGNOSIS — Y939 Activity, unspecified: Secondary | ICD-10-CM | POA: Diagnosis not present

## 2019-06-22 DIAGNOSIS — S92352A Displaced fracture of fifth metatarsal bone, left foot, initial encounter for closed fracture: Secondary | ICD-10-CM | POA: Diagnosis not present

## 2019-06-22 DIAGNOSIS — X501XXA Overexertion from prolonged static or awkward postures, initial encounter: Secondary | ICD-10-CM | POA: Diagnosis not present

## 2019-06-22 DIAGNOSIS — M79672 Pain in left foot: Secondary | ICD-10-CM | POA: Diagnosis not present

## 2019-06-29 DIAGNOSIS — S92355A Nondisplaced fracture of fifth metatarsal bone, left foot, initial encounter for closed fracture: Secondary | ICD-10-CM | POA: Diagnosis not present

## 2019-07-02 DIAGNOSIS — L570 Actinic keratosis: Secondary | ICD-10-CM | POA: Diagnosis not present

## 2019-07-08 DIAGNOSIS — A499 Bacterial infection, unspecified: Secondary | ICD-10-CM | POA: Diagnosis not present

## 2019-07-08 DIAGNOSIS — R350 Frequency of micturition: Secondary | ICD-10-CM | POA: Diagnosis not present

## 2019-07-08 DIAGNOSIS — R42 Dizziness and giddiness: Secondary | ICD-10-CM | POA: Diagnosis not present

## 2019-07-08 DIAGNOSIS — N390 Urinary tract infection, site not specified: Secondary | ICD-10-CM | POA: Diagnosis not present

## 2019-07-08 DIAGNOSIS — Z6826 Body mass index (BMI) 26.0-26.9, adult: Secondary | ICD-10-CM | POA: Diagnosis not present

## 2019-07-08 DIAGNOSIS — I10 Essential (primary) hypertension: Secondary | ICD-10-CM | POA: Diagnosis not present

## 2019-07-08 DIAGNOSIS — R600 Localized edema: Secondary | ICD-10-CM | POA: Diagnosis not present

## 2019-07-19 DIAGNOSIS — R519 Headache, unspecified: Secondary | ICD-10-CM | POA: Diagnosis not present

## 2019-07-19 DIAGNOSIS — Z6826 Body mass index (BMI) 26.0-26.9, adult: Secondary | ICD-10-CM | POA: Diagnosis not present

## 2019-07-19 DIAGNOSIS — R600 Localized edema: Secondary | ICD-10-CM | POA: Diagnosis not present

## 2019-07-19 DIAGNOSIS — G4700 Insomnia, unspecified: Secondary | ICD-10-CM | POA: Diagnosis not present

## 2019-07-19 DIAGNOSIS — G8929 Other chronic pain: Secondary | ICD-10-CM | POA: Diagnosis not present

## 2019-07-19 DIAGNOSIS — N289 Disorder of kidney and ureter, unspecified: Secondary | ICD-10-CM | POA: Diagnosis not present

## 2019-07-19 DIAGNOSIS — I10 Essential (primary) hypertension: Secondary | ICD-10-CM | POA: Diagnosis not present

## 2019-07-19 DIAGNOSIS — E785 Hyperlipidemia, unspecified: Secondary | ICD-10-CM | POA: Diagnosis not present

## 2019-07-19 DIAGNOSIS — D539 Nutritional anemia, unspecified: Secondary | ICD-10-CM | POA: Diagnosis not present

## 2019-10-07 DIAGNOSIS — R350 Frequency of micturition: Secondary | ICD-10-CM | POA: Diagnosis not present

## 2019-10-07 DIAGNOSIS — A499 Bacterial infection, unspecified: Secondary | ICD-10-CM | POA: Diagnosis not present

## 2019-10-07 DIAGNOSIS — I10 Essential (primary) hypertension: Secondary | ICD-10-CM | POA: Diagnosis not present

## 2019-10-07 DIAGNOSIS — Z6826 Body mass index (BMI) 26.0-26.9, adult: Secondary | ICD-10-CM | POA: Diagnosis not present

## 2019-10-07 DIAGNOSIS — N390 Urinary tract infection, site not specified: Secondary | ICD-10-CM | POA: Diagnosis not present

## 2019-10-19 DIAGNOSIS — L578 Other skin changes due to chronic exposure to nonionizing radiation: Secondary | ICD-10-CM | POA: Diagnosis not present

## 2019-10-19 DIAGNOSIS — L814 Other melanin hyperpigmentation: Secondary | ICD-10-CM | POA: Diagnosis not present

## 2019-10-19 DIAGNOSIS — L219 Seborrheic dermatitis, unspecified: Secondary | ICD-10-CM | POA: Diagnosis not present

## 2019-10-19 DIAGNOSIS — L570 Actinic keratosis: Secondary | ICD-10-CM | POA: Diagnosis not present

## 2019-10-19 DIAGNOSIS — L821 Other seborrheic keratosis: Secondary | ICD-10-CM | POA: Diagnosis not present

## 2019-10-25 DIAGNOSIS — E785 Hyperlipidemia, unspecified: Secondary | ICD-10-CM | POA: Diagnosis not present

## 2019-10-25 DIAGNOSIS — E559 Vitamin D deficiency, unspecified: Secondary | ICD-10-CM | POA: Diagnosis not present

## 2019-10-25 DIAGNOSIS — R519 Headache, unspecified: Secondary | ICD-10-CM | POA: Diagnosis not present

## 2019-10-25 DIAGNOSIS — N289 Disorder of kidney and ureter, unspecified: Secondary | ICD-10-CM | POA: Diagnosis not present

## 2019-10-25 DIAGNOSIS — R600 Localized edema: Secondary | ICD-10-CM | POA: Diagnosis not present

## 2019-10-25 DIAGNOSIS — I10 Essential (primary) hypertension: Secondary | ICD-10-CM | POA: Diagnosis not present

## 2019-10-25 DIAGNOSIS — G8929 Other chronic pain: Secondary | ICD-10-CM | POA: Diagnosis not present

## 2019-10-25 DIAGNOSIS — D539 Nutritional anemia, unspecified: Secondary | ICD-10-CM | POA: Diagnosis not present

## 2019-10-25 DIAGNOSIS — Z6826 Body mass index (BMI) 26.0-26.9, adult: Secondary | ICD-10-CM | POA: Diagnosis not present

## 2019-11-25 DIAGNOSIS — Z1331 Encounter for screening for depression: Secondary | ICD-10-CM | POA: Diagnosis not present

## 2019-11-25 DIAGNOSIS — E785 Hyperlipidemia, unspecified: Secondary | ICD-10-CM | POA: Diagnosis not present

## 2019-11-25 DIAGNOSIS — Z139 Encounter for screening, unspecified: Secondary | ICD-10-CM | POA: Diagnosis not present

## 2019-11-25 DIAGNOSIS — Z9181 History of falling: Secondary | ICD-10-CM | POA: Diagnosis not present

## 2019-11-25 DIAGNOSIS — Z0000 Encounter for general adult medical examination without abnormal findings: Secondary | ICD-10-CM | POA: Diagnosis not present

## 2020-01-14 DIAGNOSIS — M25512 Pain in left shoulder: Secondary | ICD-10-CM | POA: Diagnosis not present

## 2020-01-14 DIAGNOSIS — Z6826 Body mass index (BMI) 26.0-26.9, adult: Secondary | ICD-10-CM | POA: Diagnosis not present

## 2020-01-31 ENCOUNTER — Other Ambulatory Visit: Payer: Self-pay | Admitting: Internal Medicine

## 2020-01-31 DIAGNOSIS — I10 Essential (primary) hypertension: Secondary | ICD-10-CM | POA: Diagnosis not present

## 2020-01-31 DIAGNOSIS — N289 Disorder of kidney and ureter, unspecified: Secondary | ICD-10-CM | POA: Diagnosis not present

## 2020-01-31 DIAGNOSIS — D539 Nutritional anemia, unspecified: Secondary | ICD-10-CM | POA: Diagnosis not present

## 2020-01-31 DIAGNOSIS — Z6827 Body mass index (BMI) 27.0-27.9, adult: Secondary | ICD-10-CM | POA: Diagnosis not present

## 2020-01-31 DIAGNOSIS — E785 Hyperlipidemia, unspecified: Secondary | ICD-10-CM | POA: Diagnosis not present

## 2020-01-31 DIAGNOSIS — E559 Vitamin D deficiency, unspecified: Secondary | ICD-10-CM | POA: Diagnosis not present

## 2020-01-31 DIAGNOSIS — G8929 Other chronic pain: Secondary | ICD-10-CM | POA: Diagnosis not present

## 2020-01-31 DIAGNOSIS — R600 Localized edema: Secondary | ICD-10-CM | POA: Diagnosis not present

## 2020-01-31 DIAGNOSIS — R519 Headache, unspecified: Secondary | ICD-10-CM | POA: Diagnosis not present

## 2020-01-31 DIAGNOSIS — R921 Mammographic calcification found on diagnostic imaging of breast: Secondary | ICD-10-CM

## 2020-02-07 ENCOUNTER — Other Ambulatory Visit: Payer: Self-pay

## 2020-02-07 ENCOUNTER — Other Ambulatory Visit: Payer: Self-pay | Admitting: Internal Medicine

## 2020-02-07 ENCOUNTER — Ambulatory Visit
Admission: RE | Admit: 2020-02-07 | Discharge: 2020-02-07 | Disposition: A | Discharge status: Home / Self Care | Payer: Medicare HMO | Source: Ambulatory Visit | Attending: Internal Medicine | Admitting: Internal Medicine

## 2020-02-07 DIAGNOSIS — R921 Mammographic calcification found on diagnostic imaging of breast: Secondary | ICD-10-CM

## 2020-02-08 DIAGNOSIS — M7542 Impingement syndrome of left shoulder: Secondary | ICD-10-CM | POA: Diagnosis not present

## 2020-02-08 DIAGNOSIS — M25512 Pain in left shoulder: Secondary | ICD-10-CM | POA: Diagnosis not present

## 2020-02-14 DIAGNOSIS — M7542 Impingement syndrome of left shoulder: Secondary | ICD-10-CM | POA: Diagnosis not present

## 2020-02-14 DIAGNOSIS — M25512 Pain in left shoulder: Secondary | ICD-10-CM | POA: Diagnosis not present

## 2020-03-07 DIAGNOSIS — N390 Urinary tract infection, site not specified: Secondary | ICD-10-CM | POA: Diagnosis not present

## 2020-03-07 DIAGNOSIS — A499 Bacterial infection, unspecified: Secondary | ICD-10-CM | POA: Diagnosis not present

## 2020-03-07 DIAGNOSIS — R300 Dysuria: Secondary | ICD-10-CM | POA: Diagnosis not present

## 2020-03-07 DIAGNOSIS — E875 Hyperkalemia: Secondary | ICD-10-CM | POA: Diagnosis not present

## 2020-03-07 DIAGNOSIS — Z6826 Body mass index (BMI) 26.0-26.9, adult: Secondary | ICD-10-CM | POA: Diagnosis not present

## 2020-03-07 DIAGNOSIS — N289 Disorder of kidney and ureter, unspecified: Secondary | ICD-10-CM | POA: Diagnosis not present

## 2020-03-07 DIAGNOSIS — B3789 Other sites of candidiasis: Secondary | ICD-10-CM | POA: Diagnosis not present

## 2020-03-16 IMAGING — CT CT HEAD W/O CM
3 series · 16 of 47 positions shown, 19 images · non-contrast
Comparison: None.

CLINICAL DATA: Headache

EXAM:
CT HEAD WITHOUT CONTRAST
TECHNIQUE: Contiguous axial images were obtained from the base of the skull
through the vertex without intravenous contrast.

[Series 2: head wo · axial · 0.40mm/px · z∈[-166,-31]mm · 10 of 33 slices shown, 13 images]
[im 3/33  brain]
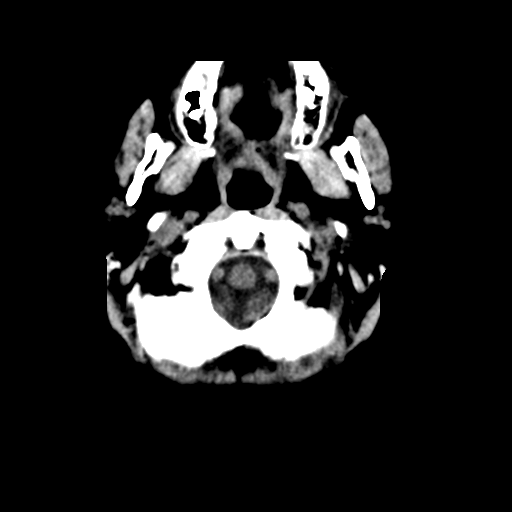
[im 3/33  bone]
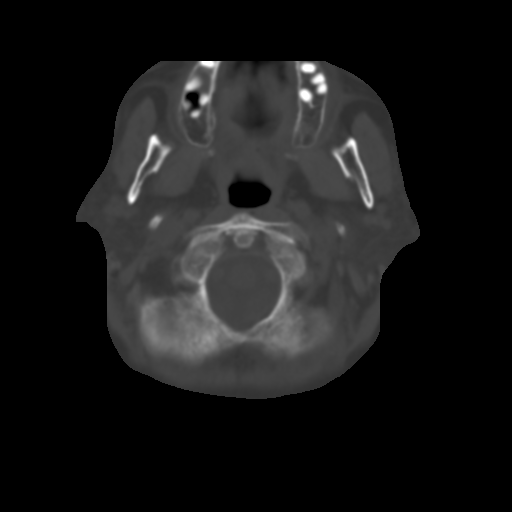
[im 6/33  brain]
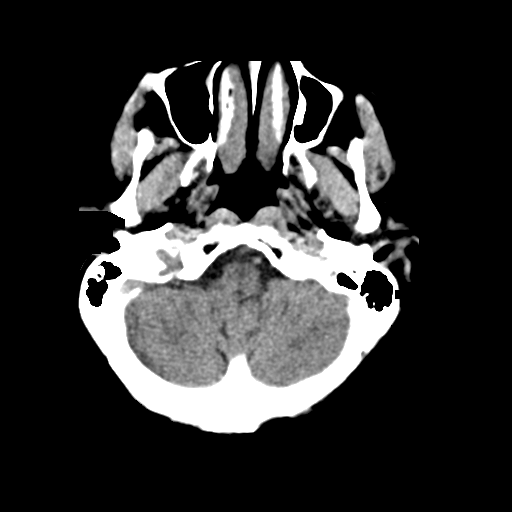
[im 9/33  brain]
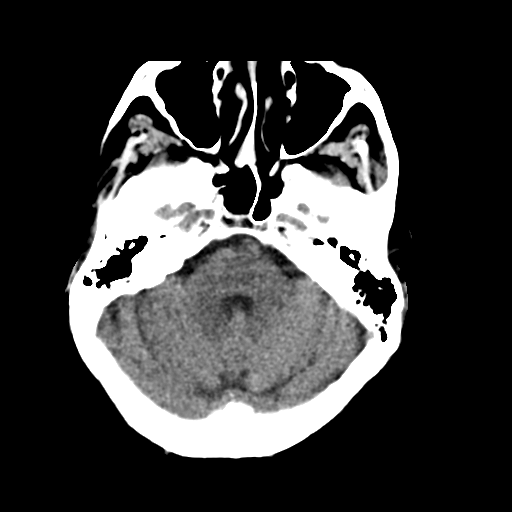
[im 12/33  brain]
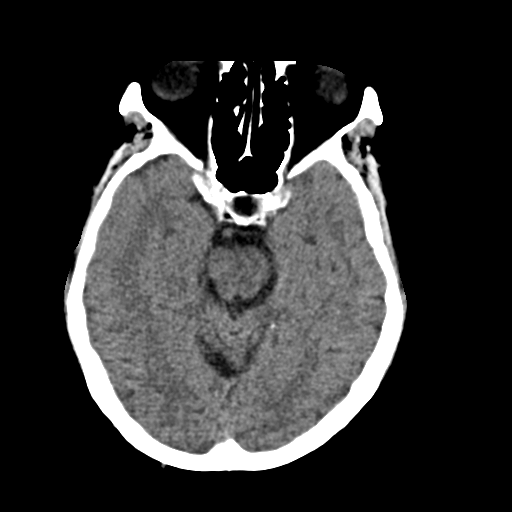
[im 15/33  brain]
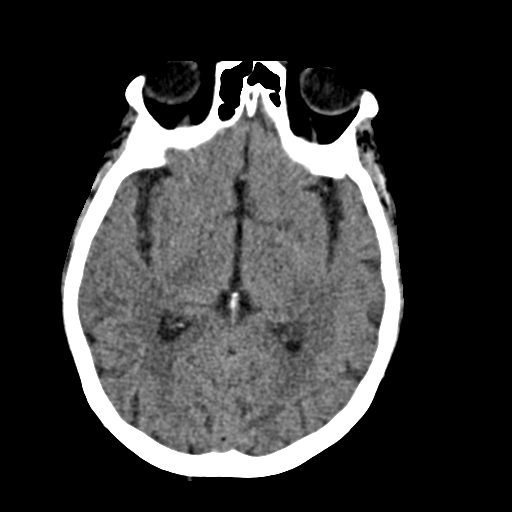
[im 15/33  bone]
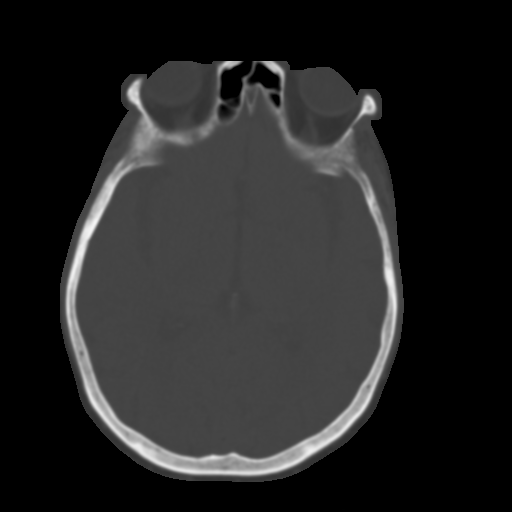
[im 18/33  brain]
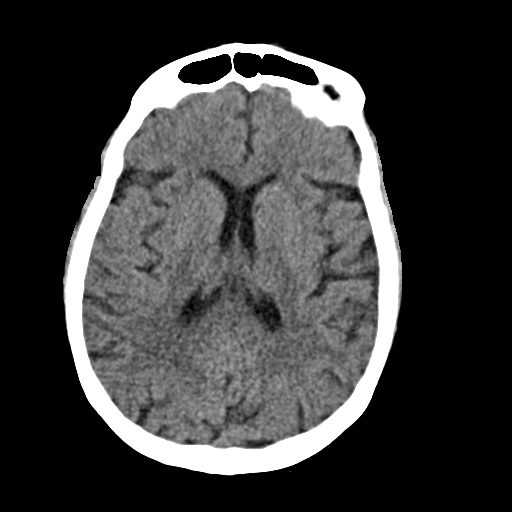
[im 21/33  brain]
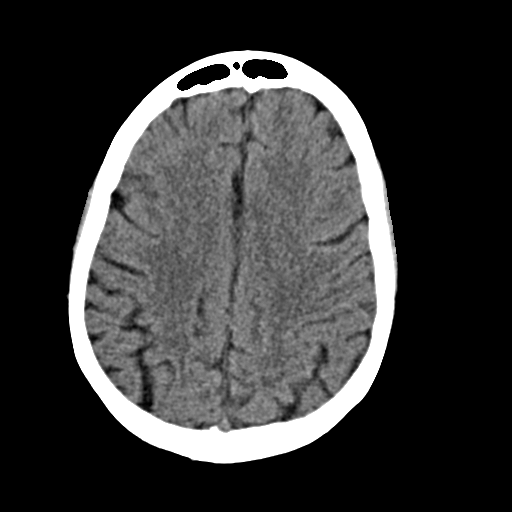
[im 25/33  brain]
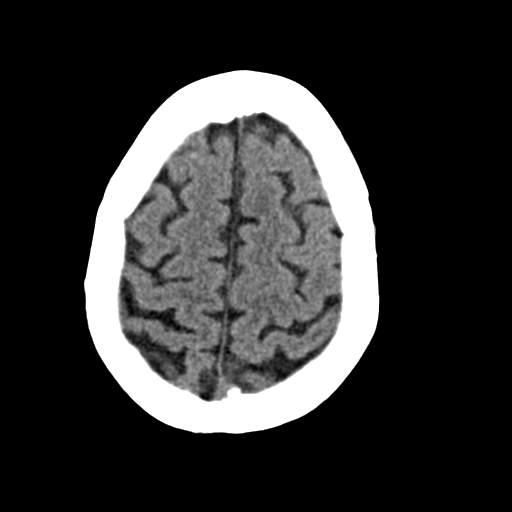
[im 27/33  brain]
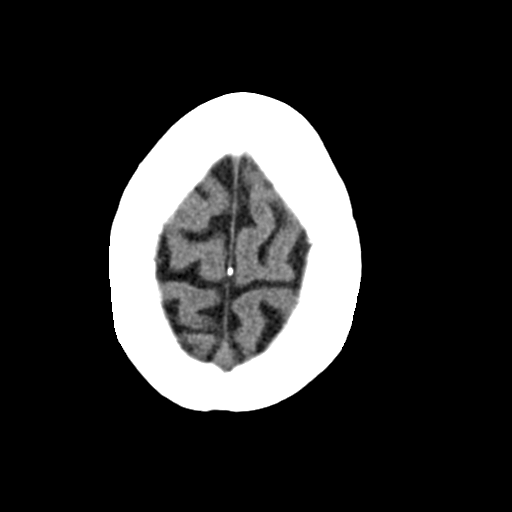
[im 27/33  bone]
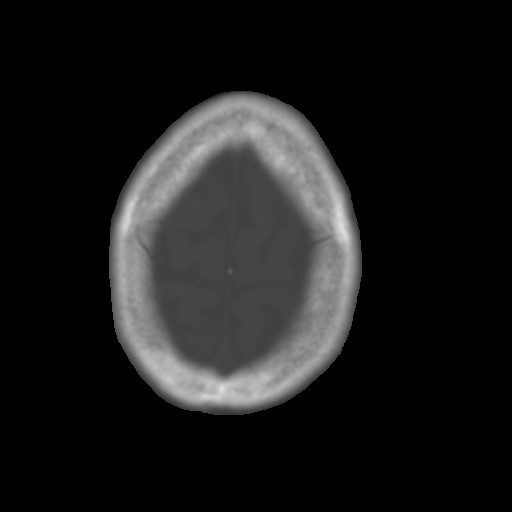
[im 30/33  brain]
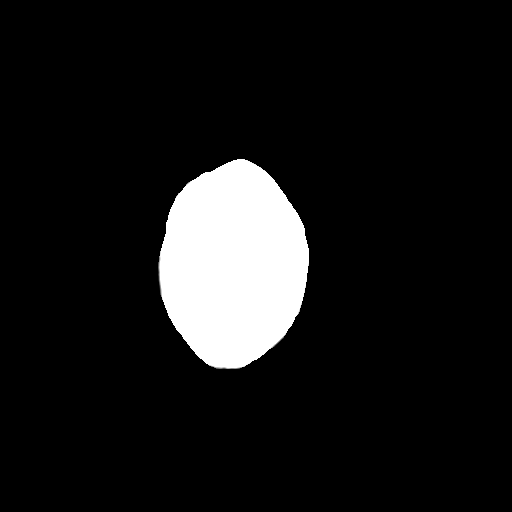

[Series 4: coronal soft · coronal · 0.34mm/px · 3 of 67 slices shown]
[im 23/67  brain]
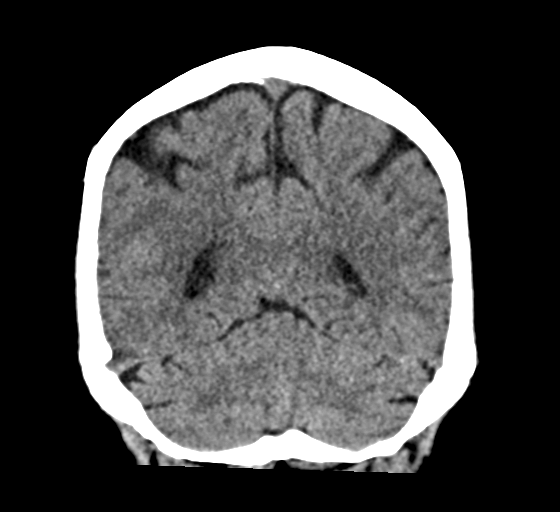
[im 30/67  brain]
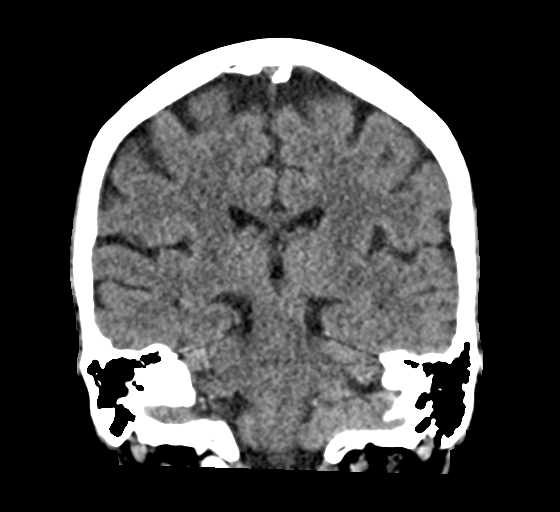
[im 37/67  brain]
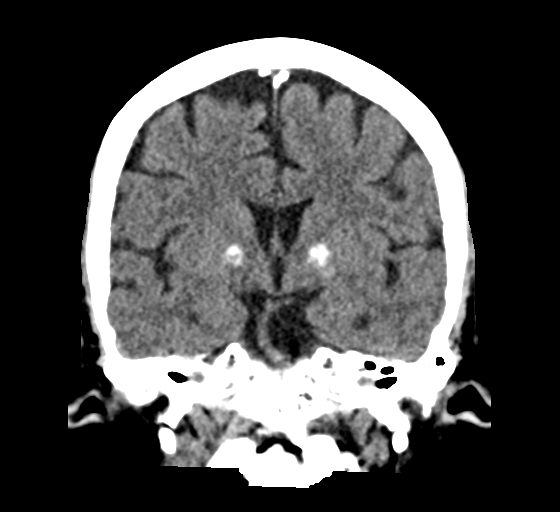

[Series 5: sag soft · sagittal · 0.34mm/px · 3 of 49 slices shown]
[im 17/49  brain]
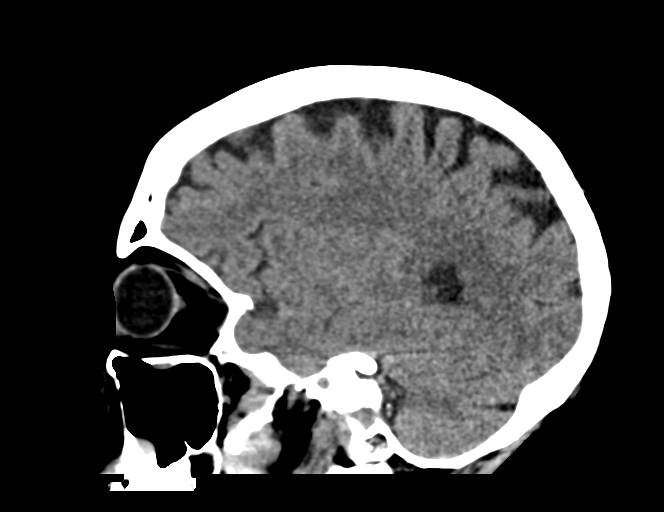
[im 25/49  brain]
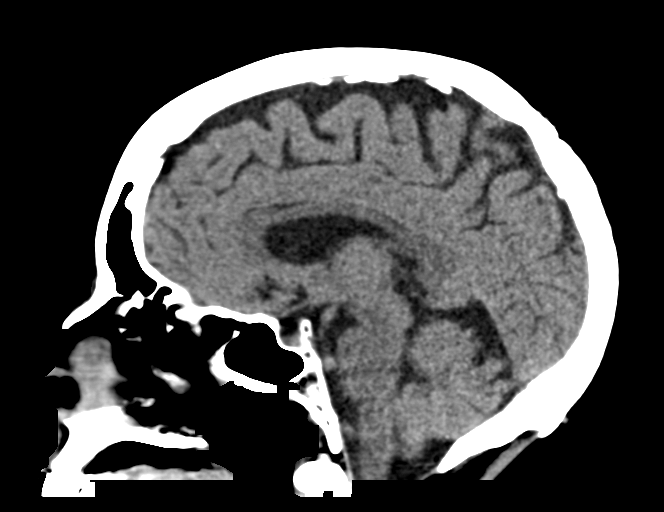
[im 33/49  brain]
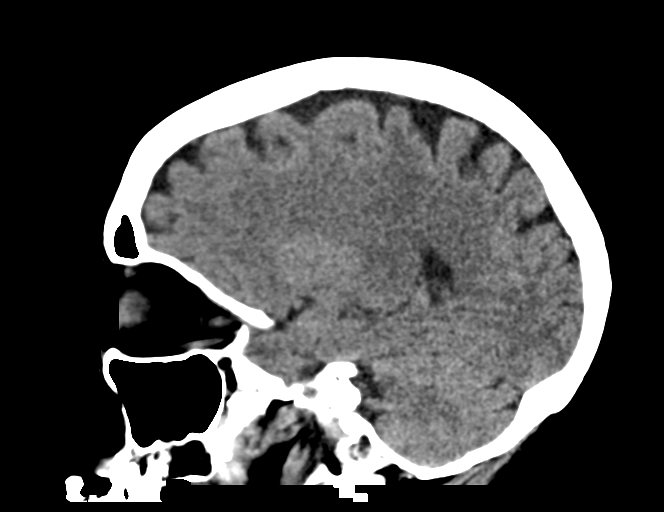

[16 of 47 positions shown; findings below may reference images not displayed]

FINDINGS: Brain: There is no mass, hemorrhage or extra-axial collection. The
size and configuration of the ventricles and extra-axial CSF spaces
are normal. The brain parenchyma is normal, without acute or chronic
infarction. Bilateral basal ganglia mineralization.

Vascular: No abnormal hyperdensity of the major intracranial
arteries or dural venous sinuses. No intracranial atherosclerosis.

Skull: The visualized skull base, calvarium and extracranial soft
tissues are normal.

Sinuses/Orbits: No fluid levels or advanced mucosal thickening of
the visualized paranasal sinuses. No mastoid or middle ear effusion.
The orbits are normal.
IMPRESSION: No acute intracranial abnormality.

## 2020-03-16 IMAGING — MR MR MRA HEAD W/O CM
1 series · 23 of 48 positions shown · IV contrast (gadavist)
Comparison: CT HEAD WITHOUT CONTRAST 02/20/2019 AND 02/12/2019.

CLINICAL DATA: Headache, acute, normal neuro exam. Episode of
sudden onset of sharp pain behind the right eye. Associated
photophobia.

EXAM:
MRI HEAD WITHOUT AND WITH CONTRAST
MRV HEAD WITHOUT AND WITH CONTRAST
MRA HEAD WITHOUT CONTRAST
TECHNIQUE: Multiplanar, multiecho pulse sequences of the brain and surrounding
structures were obtained without and with intravenous contrast.
Angiographic images of the head were obtained using MRV technique
without and with contrast. Angiographic images of the head were
obtained using MRA technique without contrast.
CONTRAST:  6mL GADAVIST GADOBUTROL 1 MMOL/ML IV SOLN

[Series 5: 3d cow · axial · 0.5mm · 0.41mm/px · z∈[-101,-2]mm · 23 of 208 slices shown]
[im 1/208]
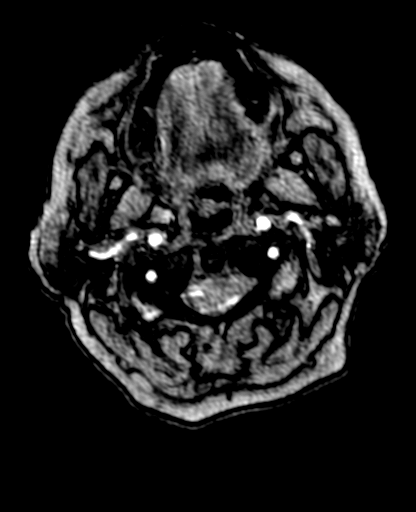
[im 5/208]
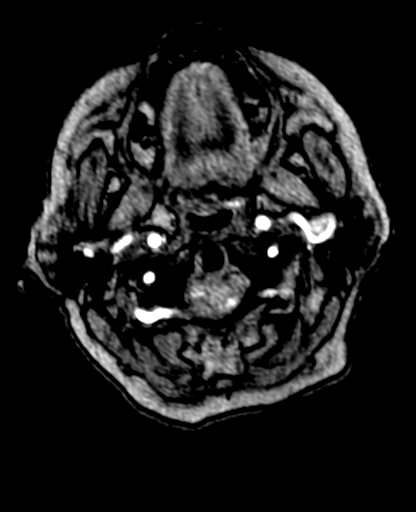
[im 9/208]
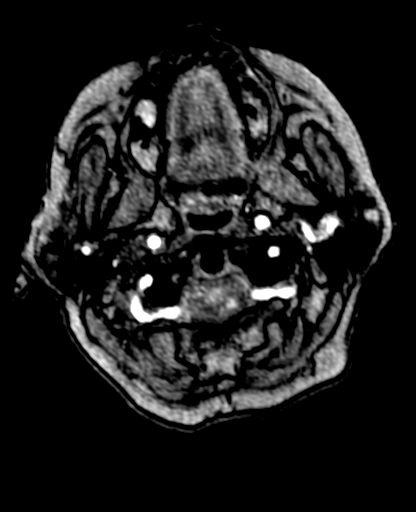
[im 14/208]
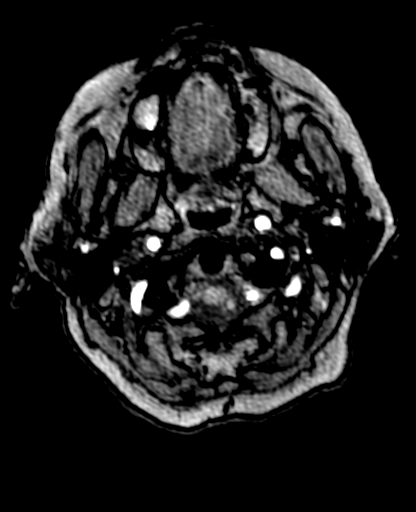
[im 18/208]
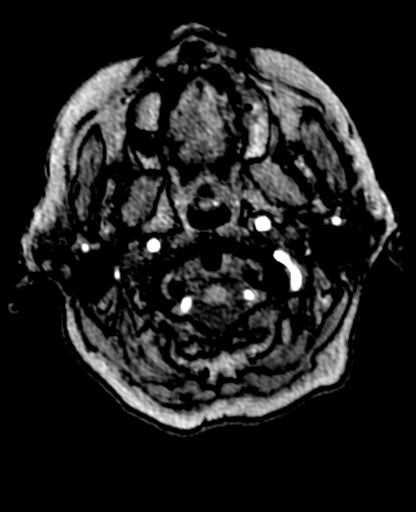
[im 23/208]
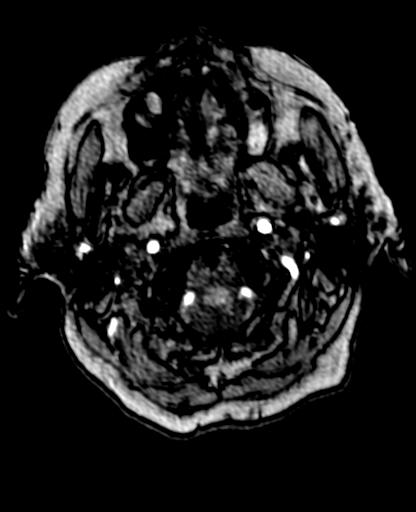
[im 27/208]
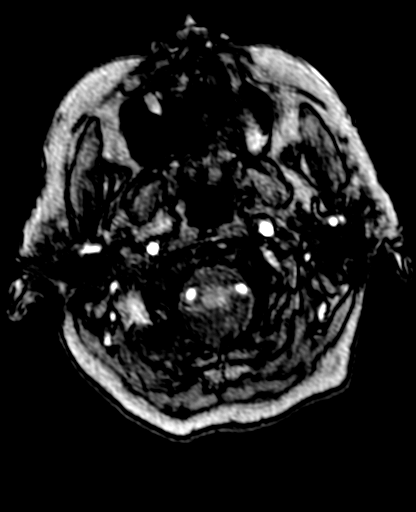
[im 31/208]
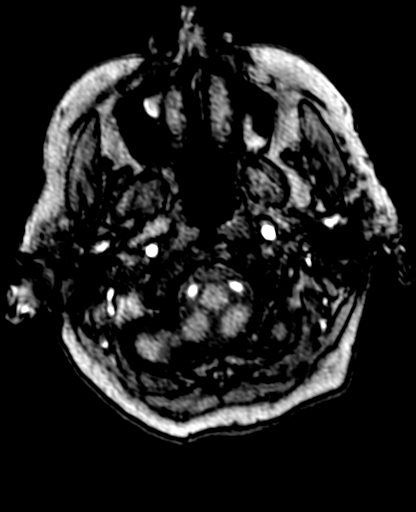
[im 36/208]
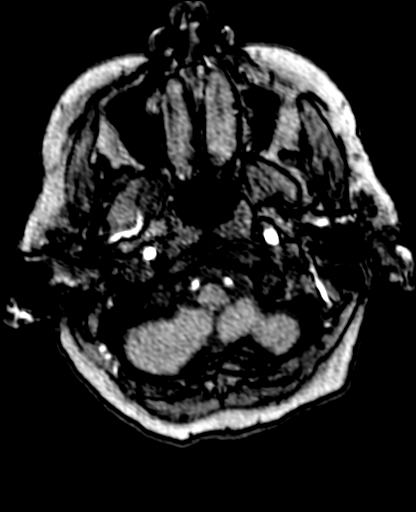
[im 40/208]
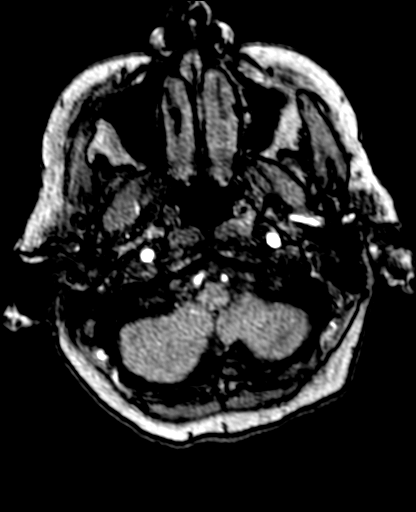
[im 45/208]
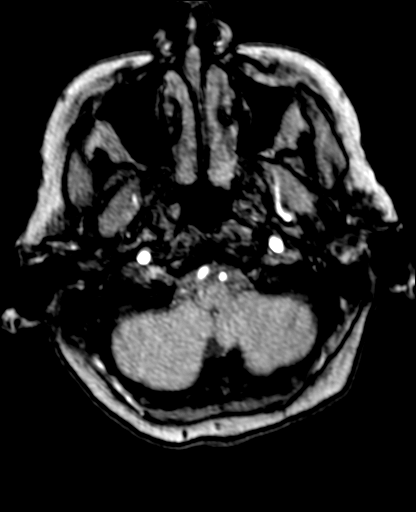
[im 49/208]
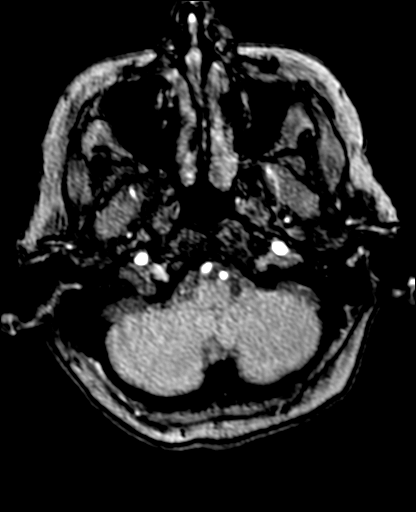
[im 53/208]
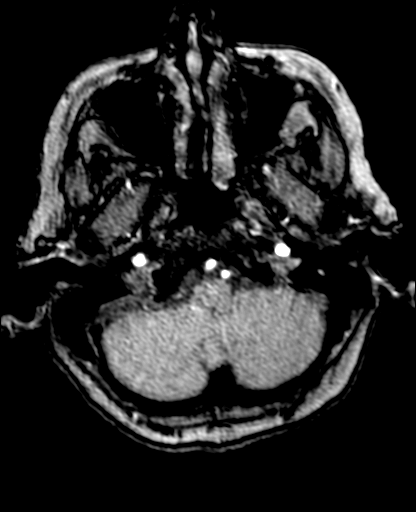
[im 58/208]
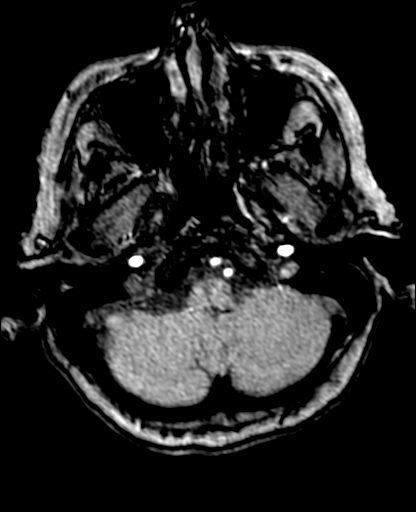
[im 62/208]
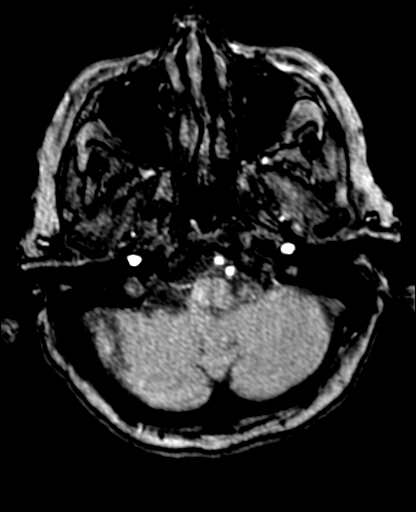
[im 67/208]
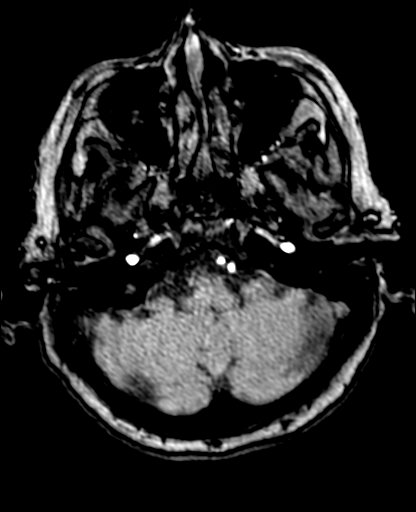
[im 93/208]
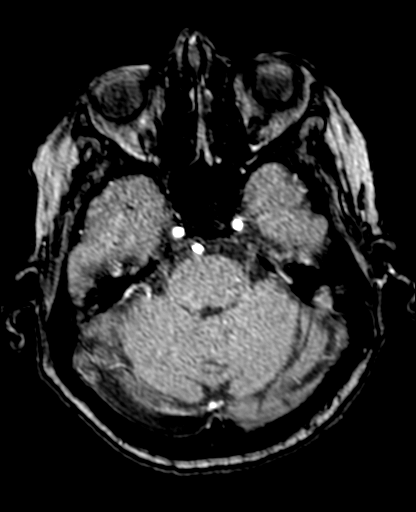
[im 106/208]
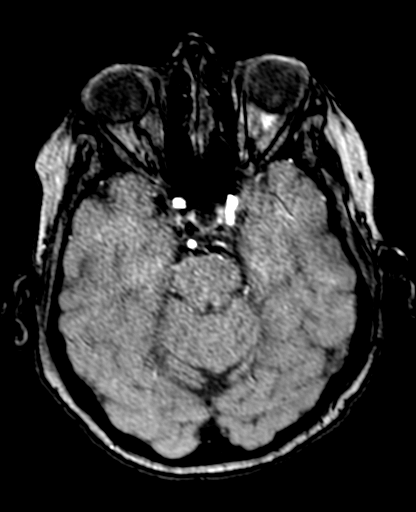
[im 119/208]
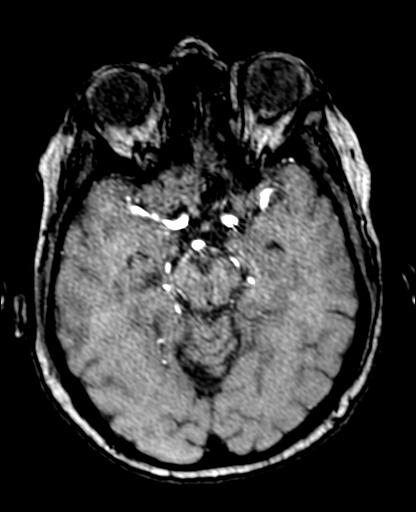
[im 146/208]
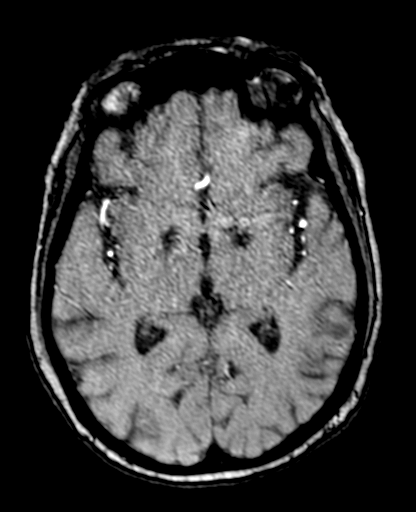
[im 172/208]
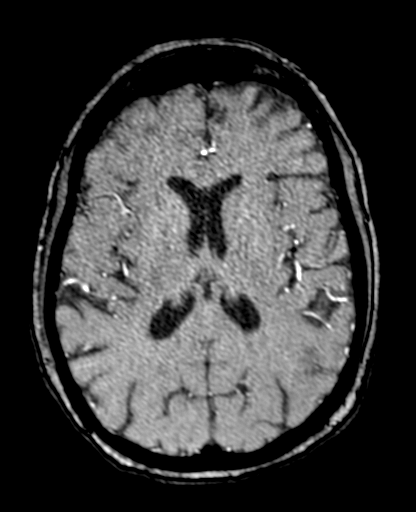
[im 177/208]
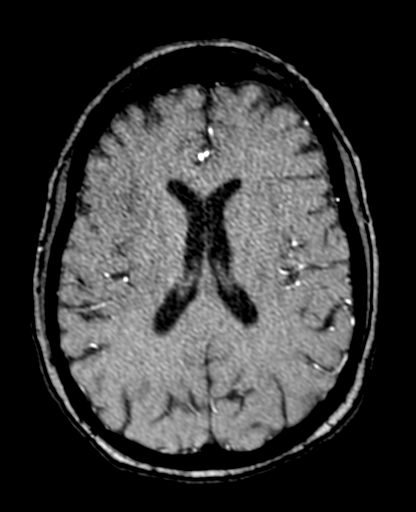
[im 199/208]
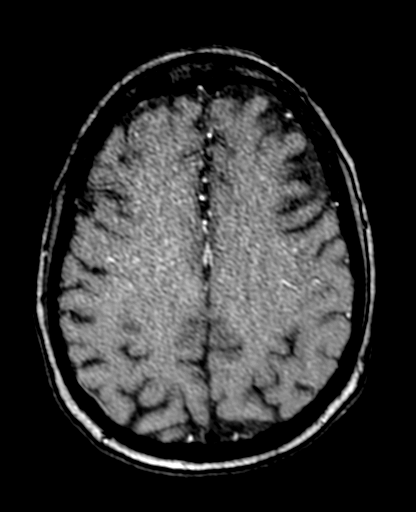

[23 of 48 positions shown; findings below may reference images not displayed]

FINDINGS: MRI HEAD FINDINGS

Brain: No acute infarct, hemorrhage, or mass lesion is present. The
ventricles are of normal size. No significant white matter lesions
are present. No significant extraaxial fluid collection is present.
The internal auditory canals are within normal limits. The brainstem
and cerebellum are within normal limits.

Postcontrast images demonstrate no pathologic enhancement.

Vascular: Flow is present in the major intracranial arteries.

Skull and upper cervical spine: The craniocervical junction is
normal. Upper cervical spine is within normal limits. Marrow signal
is unremarkable.

Sinuses/Orbits: A polyp or mucous retention cyst is present at the
floor of the right maxillary sinus. The paranasal sinuses and
mastoid air cells are otherwise clear. Bilateral lens replacements
are noted. Globes and orbits are otherwise unremarkable.

MRV HEAD FINDINGS

The dural sinuses are patent. Right transverse sinus is dominant.
Sigmoid sinus and jugular vein is normal bilaterally. The straight
sinus and deep cerebral veins are patent. Cortical veins are
unremarkable.

MRA HEAD FINDINGS

There is mild narrowing of less than 50% in the supraclinoid right
ICA. The internal carotid arteries are otherwise within normal
limits from the high cervical segments through the ICA termini.
There is a fenestration of the proximal right M1 segment, a normal
variant. The A1 and M1 segments are otherwise normal. The anterior
communicating artery is patent. MCA bifurcations are intact. ACA and
MCA branch vessels demonstrate some irregularity without significant
proximal stenosis or occlusion.

The vertebral arteries are codominant. Right PICA origins visualized
and normal. Left AICA is dominant. Basilar artery is normal. Both
posterior cerebral arteries originate from the basilar tip. There is
some irregularity of distal PCA branch vessels without significant
proximal stenosis or occlusion.
IMPRESSION: 1. Normal MRI appearance of the brain. No acute or focal lesion to
explain the patient's symptoms.
2. Normal variant MRA Circle of Willis without evidence for
significant proximal stenosis, aneurysm, or branch vessel occlusion.
3. Mild distal small vessel disease is present in both the anterior
and posterior circulation.
4. Normal MRV of the head without and with contrast. No focal
stenosis or thrombosis.

## 2020-04-14 DIAGNOSIS — R519 Headache, unspecified: Secondary | ICD-10-CM | POA: Diagnosis not present

## 2020-04-14 DIAGNOSIS — G8929 Other chronic pain: Secondary | ICD-10-CM | POA: Diagnosis not present

## 2020-04-14 DIAGNOSIS — A499 Bacterial infection, unspecified: Secondary | ICD-10-CM | POA: Diagnosis not present

## 2020-04-14 DIAGNOSIS — N390 Urinary tract infection, site not specified: Secondary | ICD-10-CM | POA: Diagnosis not present

## 2020-04-14 DIAGNOSIS — I10 Essential (primary) hypertension: Secondary | ICD-10-CM | POA: Diagnosis not present

## 2020-04-14 DIAGNOSIS — R300 Dysuria: Secondary | ICD-10-CM | POA: Diagnosis not present

## 2020-04-17 DIAGNOSIS — A499 Bacterial infection, unspecified: Secondary | ICD-10-CM | POA: Diagnosis not present

## 2020-04-17 DIAGNOSIS — N390 Urinary tract infection, site not specified: Secondary | ICD-10-CM | POA: Diagnosis not present

## 2020-04-17 DIAGNOSIS — R21 Rash and other nonspecific skin eruption: Secondary | ICD-10-CM | POA: Diagnosis not present

## 2020-04-17 DIAGNOSIS — T7840XA Allergy, unspecified, initial encounter: Secondary | ICD-10-CM | POA: Diagnosis not present

## 2020-04-19 DIAGNOSIS — C44622 Squamous cell carcinoma of skin of right upper limb, including shoulder: Secondary | ICD-10-CM | POA: Diagnosis not present

## 2020-04-19 DIAGNOSIS — L570 Actinic keratosis: Secondary | ICD-10-CM | POA: Diagnosis not present

## 2020-04-19 DIAGNOSIS — L853 Xerosis cutis: Secondary | ICD-10-CM | POA: Diagnosis not present

## 2020-05-02 DIAGNOSIS — G8929 Other chronic pain: Secondary | ICD-10-CM | POA: Diagnosis not present

## 2020-05-02 DIAGNOSIS — E785 Hyperlipidemia, unspecified: Secondary | ICD-10-CM | POA: Diagnosis not present

## 2020-05-02 DIAGNOSIS — I10 Essential (primary) hypertension: Secondary | ICD-10-CM | POA: Diagnosis not present

## 2020-05-02 DIAGNOSIS — R519 Headache, unspecified: Secondary | ICD-10-CM | POA: Diagnosis not present

## 2020-05-02 DIAGNOSIS — N390 Urinary tract infection, site not specified: Secondary | ICD-10-CM | POA: Diagnosis not present

## 2020-05-02 DIAGNOSIS — R600 Localized edema: Secondary | ICD-10-CM | POA: Diagnosis not present

## 2020-05-02 DIAGNOSIS — N289 Disorder of kidney and ureter, unspecified: Secondary | ICD-10-CM | POA: Diagnosis not present

## 2020-05-02 DIAGNOSIS — Z6825 Body mass index (BMI) 25.0-25.9, adult: Secondary | ICD-10-CM | POA: Diagnosis not present

## 2020-05-02 DIAGNOSIS — E559 Vitamin D deficiency, unspecified: Secondary | ICD-10-CM | POA: Diagnosis not present

## 2020-05-02 DIAGNOSIS — D539 Nutritional anemia, unspecified: Secondary | ICD-10-CM | POA: Diagnosis not present

## 2020-05-09 DIAGNOSIS — C44622 Squamous cell carcinoma of skin of right upper limb, including shoulder: Secondary | ICD-10-CM | POA: Diagnosis not present

## 2020-05-29 DIAGNOSIS — J309 Allergic rhinitis, unspecified: Secondary | ICD-10-CM | POA: Diagnosis not present

## 2020-05-29 DIAGNOSIS — N3090 Cystitis, unspecified without hematuria: Secondary | ICD-10-CM | POA: Diagnosis not present

## 2020-08-05 DIAGNOSIS — S61511A Laceration without foreign body of right wrist, initial encounter: Secondary | ICD-10-CM | POA: Diagnosis not present

## 2020-09-07 DIAGNOSIS — N390 Urinary tract infection, site not specified: Secondary | ICD-10-CM | POA: Diagnosis not present

## 2020-09-07 DIAGNOSIS — R5381 Other malaise: Secondary | ICD-10-CM | POA: Diagnosis not present

## 2020-09-07 DIAGNOSIS — A499 Bacterial infection, unspecified: Secondary | ICD-10-CM | POA: Diagnosis not present

## 2020-09-07 DIAGNOSIS — R5383 Other fatigue: Secondary | ICD-10-CM | POA: Diagnosis not present

## 2020-09-11 DIAGNOSIS — N289 Disorder of kidney and ureter, unspecified: Secondary | ICD-10-CM | POA: Diagnosis not present

## 2020-09-11 DIAGNOSIS — Z6825 Body mass index (BMI) 25.0-25.9, adult: Secondary | ICD-10-CM | POA: Diagnosis not present

## 2020-09-11 DIAGNOSIS — N390 Urinary tract infection, site not specified: Secondary | ICD-10-CM | POA: Diagnosis not present

## 2020-09-11 DIAGNOSIS — I10 Essential (primary) hypertension: Secondary | ICD-10-CM | POA: Diagnosis not present

## 2020-09-11 DIAGNOSIS — E785 Hyperlipidemia, unspecified: Secondary | ICD-10-CM | POA: Diagnosis not present

## 2020-09-11 DIAGNOSIS — E559 Vitamin D deficiency, unspecified: Secondary | ICD-10-CM | POA: Diagnosis not present

## 2020-09-11 DIAGNOSIS — D539 Nutritional anemia, unspecified: Secondary | ICD-10-CM | POA: Diagnosis not present

## 2020-09-11 DIAGNOSIS — G8929 Other chronic pain: Secondary | ICD-10-CM | POA: Diagnosis not present

## 2020-09-11 DIAGNOSIS — R519 Headache, unspecified: Secondary | ICD-10-CM | POA: Diagnosis not present

## 2020-10-05 DIAGNOSIS — M7661 Achilles tendinitis, right leg: Secondary | ICD-10-CM | POA: Diagnosis not present

## 2020-10-05 DIAGNOSIS — M7731 Calcaneal spur, right foot: Secondary | ICD-10-CM | POA: Diagnosis not present

## 2020-10-05 DIAGNOSIS — M6701 Short Achilles tendon (acquired), right ankle: Secondary | ICD-10-CM | POA: Diagnosis not present

## 2020-10-07 ENCOUNTER — Encounter (HOSPITAL_BASED_OUTPATIENT_CLINIC_OR_DEPARTMENT_OTHER): Payer: Self-pay

## 2020-10-07 ENCOUNTER — Inpatient Hospital Stay (HOSPITAL_BASED_OUTPATIENT_CLINIC_OR_DEPARTMENT_OTHER)
Admission: EM | Admit: 2020-10-07 | Discharge: 2020-10-11 | DRG: 392 | Disposition: A | Discharge status: Home / Self Care | Payer: Medicare HMO | Attending: Family Medicine | Admitting: Family Medicine

## 2020-10-07 ENCOUNTER — Other Ambulatory Visit: Payer: Self-pay

## 2020-10-07 ENCOUNTER — Emergency Department (HOSPITAL_BASED_OUTPATIENT_CLINIC_OR_DEPARTMENT_OTHER): Payer: Medicare HMO

## 2020-10-07 DIAGNOSIS — E872 Acidosis: Secondary | ICD-10-CM | POA: Diagnosis present

## 2020-10-07 DIAGNOSIS — E876 Hypokalemia: Secondary | ICD-10-CM | POA: Diagnosis present

## 2020-10-07 DIAGNOSIS — B9620 Unspecified Escherichia coli [E. coli] as the cause of diseases classified elsewhere: Secondary | ICD-10-CM | POA: Diagnosis present

## 2020-10-07 DIAGNOSIS — Z9049 Acquired absence of other specified parts of digestive tract: Secondary | ICD-10-CM | POA: Diagnosis not present

## 2020-10-07 DIAGNOSIS — I10 Essential (primary) hypertension: Secondary | ICD-10-CM | POA: Diagnosis not present

## 2020-10-07 DIAGNOSIS — Z882 Allergy status to sulfonamides status: Secondary | ICD-10-CM | POA: Diagnosis not present

## 2020-10-07 DIAGNOSIS — K5730 Diverticulosis of large intestine without perforation or abscess without bleeding: Secondary | ICD-10-CM | POA: Diagnosis present

## 2020-10-07 DIAGNOSIS — R112 Nausea with vomiting, unspecified: Principal | ICD-10-CM

## 2020-10-07 DIAGNOSIS — R197 Diarrhea, unspecified: Secondary | ICD-10-CM

## 2020-10-07 DIAGNOSIS — A09 Infectious gastroenteritis and colitis, unspecified: Secondary | ICD-10-CM | POA: Diagnosis not present

## 2020-10-07 DIAGNOSIS — M810 Age-related osteoporosis without current pathological fracture: Secondary | ICD-10-CM | POA: Diagnosis present

## 2020-10-07 DIAGNOSIS — Z8616 Personal history of COVID-19: Secondary | ICD-10-CM | POA: Diagnosis not present

## 2020-10-07 DIAGNOSIS — E7800 Pure hypercholesterolemia, unspecified: Secondary | ICD-10-CM | POA: Diagnosis present

## 2020-10-07 DIAGNOSIS — Z91018 Allergy to other foods: Secondary | ICD-10-CM | POA: Diagnosis not present

## 2020-10-07 DIAGNOSIS — G4733 Obstructive sleep apnea (adult) (pediatric): Secondary | ICD-10-CM | POA: Diagnosis present

## 2020-10-07 DIAGNOSIS — K529 Noninfective gastroenteritis and colitis, unspecified: Secondary | ICD-10-CM | POA: Diagnosis present

## 2020-10-07 DIAGNOSIS — I129 Hypertensive chronic kidney disease with stage 1 through stage 4 chronic kidney disease, or unspecified chronic kidney disease: Secondary | ICD-10-CM | POA: Diagnosis present

## 2020-10-07 DIAGNOSIS — Z7983 Long term (current) use of bisphosphonates: Secondary | ICD-10-CM

## 2020-10-07 DIAGNOSIS — R109 Unspecified abdominal pain: Secondary | ICD-10-CM | POA: Diagnosis not present

## 2020-10-07 DIAGNOSIS — N179 Acute kidney failure, unspecified: Secondary | ICD-10-CM | POA: Diagnosis not present

## 2020-10-07 DIAGNOSIS — D649 Anemia, unspecified: Secondary | ICD-10-CM | POA: Diagnosis not present

## 2020-10-07 DIAGNOSIS — N390 Urinary tract infection, site not specified: Secondary | ICD-10-CM | POA: Diagnosis not present

## 2020-10-07 DIAGNOSIS — Z90711 Acquired absence of uterus with remaining cervical stump: Secondary | ICD-10-CM

## 2020-10-07 DIAGNOSIS — N1832 Chronic kidney disease, stage 3b: Secondary | ICD-10-CM | POA: Diagnosis not present

## 2020-10-07 DIAGNOSIS — Z79899 Other long term (current) drug therapy: Secondary | ICD-10-CM

## 2020-10-07 DIAGNOSIS — Z20822 Contact with and (suspected) exposure to covid-19: Secondary | ICD-10-CM | POA: Diagnosis present

## 2020-10-07 DIAGNOSIS — E871 Hypo-osmolality and hyponatremia: Secondary | ICD-10-CM | POA: Diagnosis present

## 2020-10-07 LAB — LIPASE, BLOOD: Lipase: 21 U/L (ref 11–51)

## 2020-10-07 LAB — URINALYSIS, MICROSCOPIC (REFLEX)

## 2020-10-07 LAB — CBC WITH DIFFERENTIAL/PLATELET
Abs Immature Granulocytes: 0.1 10*3/uL — ABNORMAL HIGH (ref 0.00–0.07)
Basophils Absolute: 0 10*3/uL (ref 0.0–0.1)
Basophils Relative: 0 %
Eosinophils Absolute: 0 10*3/uL (ref 0.0–0.5)
Eosinophils Relative: 0 %
HCT: 32.3 % — ABNORMAL LOW (ref 36.0–46.0)
Hemoglobin: 11.1 g/dL — ABNORMAL LOW (ref 12.0–15.0)
Immature Granulocytes: 1 %
Lymphocytes Relative: 7 %
Lymphs Abs: 1.3 10*3/uL (ref 0.7–4.0)
MCH: 31.1 pg (ref 26.0–34.0)
MCHC: 34.4 g/dL (ref 30.0–36.0)
MCV: 90.5 fL (ref 80.0–100.0)
Monocytes Absolute: 0.9 10*3/uL (ref 0.1–1.0)
Monocytes Relative: 5 %
Neutro Abs: 16.1 10*3/uL — ABNORMAL HIGH (ref 1.7–7.7)
Neutrophils Relative %: 87 %
Platelets: 284 10*3/uL (ref 150–400)
RBC: 3.57 MIL/uL — ABNORMAL LOW (ref 3.87–5.11)
RDW: 12.1 % (ref 11.5–15.5)
WBC: 18.4 10*3/uL — ABNORMAL HIGH (ref 4.0–10.5)
nRBC: 0 % (ref 0.0–0.2)

## 2020-10-07 LAB — URINALYSIS, ROUTINE W REFLEX MICROSCOPIC
Bilirubin Urine: NEGATIVE
Glucose, UA: NEGATIVE mg/dL
Ketones, ur: NEGATIVE mg/dL
Nitrite: NEGATIVE
Protein, ur: NEGATIVE mg/dL
Specific Gravity, Urine: 1.015 (ref 1.005–1.030)
pH: 6 (ref 5.0–8.0)

## 2020-10-07 LAB — COMPREHENSIVE METABOLIC PANEL
ALT: 16 U/L (ref 0–44)
AST: 24 U/L (ref 15–41)
Albumin: 3.7 g/dL (ref 3.5–5.0)
Alkaline Phosphatase: 52 U/L (ref 38–126)
Anion gap: 9 (ref 5–15)
BUN: 16 mg/dL (ref 8–23)
CO2: 23 mmol/L (ref 22–32)
Calcium: 8.9 mg/dL (ref 8.9–10.3)
Chloride: 103 mmol/L (ref 98–111)
Creatinine, Ser: 1.18 mg/dL — ABNORMAL HIGH (ref 0.44–1.00)
GFR, Estimated: 48 mL/min — ABNORMAL LOW (ref >60)
Glucose, Bld: 121 mg/dL — ABNORMAL HIGH (ref 70–99)
Potassium: 4.2 mmol/L (ref 3.5–5.1)
Sodium: 135 mmol/L (ref 135–145)
Total Bilirubin: 0.6 mg/dL (ref 0.3–1.2)
Total Protein: 6.6 g/dL (ref 6.5–8.1)

## 2020-10-07 MED ORDER — SODIUM CHLORIDE 0.9 % IV BOLUS
1000.0000 mL | Freq: Once | INTRAVENOUS | Status: AC
  Administered 2020-10-07: 1000 mL via INTRAVENOUS

## 2020-10-07 MED ORDER — ACETAMINOPHEN 325 MG PO TABS
650.0000 mg | ORAL_TABLET | Freq: Once | ORAL | Status: AC
  Administered 2020-10-07: 650 mg via ORAL
  Filled 2020-10-07: qty 2

## 2020-10-07 MED ORDER — PROCHLORPERAZINE EDISYLATE 10 MG/2ML IJ SOLN
10.0000 mg | Freq: Once | INTRAMUSCULAR | Status: AC
  Administered 2020-10-07: 10 mg via INTRAVENOUS
  Filled 2020-10-07: qty 2

## 2020-10-07 MED ORDER — ONDANSETRON HCL 4 MG/2ML IJ SOLN
4.0000 mg | Freq: Once | INTRAMUSCULAR | Status: AC
  Administered 2020-10-07: 4 mg via INTRAVENOUS
  Filled 2020-10-07: qty 2

## 2020-10-07 MED ORDER — IOHEXOL 300 MG/ML  SOLN
75.0000 mL | Freq: Once | INTRAMUSCULAR | Status: AC | PRN
  Administered 2020-10-07: 75 mL via INTRAVENOUS

## 2020-10-07 NOTE — ED Triage Notes (Addendum)
Pt c/o abd pain, chest pain, N/V/D, fever TMax 100 with symptoms starting yesterday. Per pt's daughter pt has been having N/V intermittently x 4 weeks.

## 2020-10-07 NOTE — ED Provider Notes (Signed)
[facility] EMERGENCY DEPARTMENT Provider Note   CSN: [id number] Arrival date & time: 10/07/20  1829     History Chief Complaint  Patient presents with   Abdominal Pain    Tahesha Skeet is a 75 y.o. female.  75 y.o female with a PMH of CKD, HTN, presents to the ED with a chief complaint of abdominal pain that has been ongoing for the past week.  According to daughter who is providing most of the history, patient's had nausea and intermittent vomiting for the past week, states since last night she is unable to keep anything down.  Endorses trying to eat small bites, however everything comes back up immediately.  She has tried to take some Pedialyte without much improvement in her symptoms.  She has tried pedialyte, but with no improvement in symptoms.  Patient feels overall weak, stating that she has not been able to eat in the last couple of days.  She did recently finish an antibiotic about 2 weeks ago, unknown which kind of this was.  Daughter also reports that when symptoms worsen 2 days ago she had previously had a corn dog.  T-max of 100 while at home, also took some Tylenol and Motrin but without much improvement in symptoms.  He denies any chest pain, no shortness of breath.  Prior surgical history remarkable for appendectomy and partial hysterectomy.  The history is provided by the patient and medical records.  Abdominal Pain Pain location:  Generalized Pain quality: aching   Pain radiates to:  Does not radiate Pain severity:  Moderate Onset quality:  Gradual Duration:  1 week Timing:  Constant Progression:  Worsening Chronicity:  New Context: previous surgery, recent illness and suspicious food intake   Context: not alcohol use and not awakening from sleep   Relieved by:  Nothing Worsened by:  Eating Associated symptoms: chills, diarrhea, fever, nausea and vomiting   Associated symptoms: no chest pain, no constipation, no shortness of breath and no sore throat        Past Medical History:  Diagnosis Date   CKD (chronic kidney disease) stage 3, GFR 30-59 ml/min (HCC)    Hypercholesteremia    Hypertension    Osteoporosis     Patient Active Problem List   Diagnosis Date Noted   Status migrainosus    Headache 02/20/2019   Essential hypertension 02/20/2019   Dyslipidemia 02/20/2019   Stage 3b chronic kidney disease ([place]) 02/20/2019    Past Surgical History:  Procedure Laterality Date   ABDOMINAL HYSTERECTOMY     APPENDECTOMY       OB History   No obstetric history on file.     Family History  Problem Relation Age of Onset   Diabetes Father 76   Dementia Other        siblings, multiple   Diabetes Daughter    Stroke Neg Hx     Social History   Tobacco Use   Smoking status: Never   Smokeless tobacco: Never  Vaping Use   Vaping Use: Never used  Substance Use Topics   Alcohol use: Yes    Comment: rare   Drug use: Never    Home Medications Prior to Admission medications   Medication Sig Start Date End Date Taking? Authorizing Provider  alendronate (FOSAMAX) 70 MG tablet Take 70 mg by mouth once a week. Take with a full glass of water on an empty stomach.    [provider]  amLODipine (NORVASC) 5 MG tablet Take 1  tablet (5 mg total) by mouth daily. 02/21/19 03/23/19  Darliss Cheney, MD  atorvastatin (LIPITOR) 20 MG tablet Take 20 mg by mouth daily.    [provider]  Bisacodyl (LAXATIVE PO) Take 1 tablet by mouth daily.    [provider]  cholecalciferol (VITAMIN D3) 25 MCG (1000 UT) tablet Take 1,000 Units by mouth daily.    [provider]  ciprofloxacin (CIPRO) 250 MG tablet Take 250 mg by mouth 2 (two) times daily. 02/17/19   [provider]  Fe Fum-Fe Poly-Vit C-Lactobac (FUSION PO) Take 1 capsule by mouth daily.    [provider]  gabapentin (NEURONTIN) 100 MG capsule Take 1 tablets 3 times daily for 1 week and then take 2 tablets 3 times daily after that.  02/21/19   Darliss Cheney, MD  metoCLOPramide (REGLAN) 10 MG tablet Take 10 mg by mouth every 6 (six) hours as needed for nausea/vomiting. 02/12/19   [provider]  omeprazole (PRILOSEC) 40 MG capsule Take 40 mg by mouth daily. 02/11/19   [provider]  ondansetron (ZOFRAN) 4 MG tablet Take 4 mg by mouth 3 (three) times daily as needed for nausea/vomiting. 02/12/19   [provider]  Probiotic Product (PROBIOTIC PO) Take 1 tablet by mouth daily.    [provider]  lisinopril-hydrochlorothiazide (ZESTORETIC) 10-12.5 MG tablet Take 1 tablet by mouth daily.  10/10/18  [provider]    Allergies    Apple and Other  Review of Systems   Review of Systems  Constitutional:  Positive for chills and fever.  HENT:  Negative for sore throat.   Respiratory:  Negative for shortness of breath.   Cardiovascular:  Negative for chest pain.  Gastrointestinal:  Positive for abdominal pain, diarrhea, nausea and vomiting. Negative for constipation.  Genitourinary:  Negative for flank pain.  Musculoskeletal:  Negative for back pain.  Skin:  Negative for pallor and wound.  Neurological:  Negative for light-headedness.  All other systems reviewed and are negative.  Physical Exam Updated Vital Signs BP (!) 134/52   Pulse 94   Temp 100.2 F (37.9 C) (Oral)   Resp 18   Ht 5\' 2"  (1.575 m)   Wt 61.7 kg   SpO2 97%   BMI 24.87 kg/m   Physical Exam Vitals and nursing note reviewed.  Constitutional:      Appearance: She is well-developed. She is ill-appearing.  HENT:     Head: Normocephalic and atraumatic.     Mouth/Throat:     Mouth: Mucous membranes are moist.  Cardiovascular:     Rate and Rhythm: Normal rate.  Pulmonary:     Effort: Pulmonary effort is normal.     Breath sounds: No wheezing.  Abdominal:     General: Abdomen is flat. Bowel sounds are increased. There is distension.     Palpations: Abdomen is soft.     Tenderness: There is  abdominal tenderness in the epigastric area and left upper quadrant. There is guarding.  Skin:    General: Skin is warm and dry.  Neurological:     Mental Status: She is alert.    ED Results / Procedures / Treatments   Labs (all labs ordered are listed, but only abnormal results are displayed) Labs Reviewed  CBC WITH DIFFERENTIAL/PLATELET - Abnormal; Notable for the following components:      Result Value   WBC 18.4 (*)    RBC 3.57 (*)    Hemoglobin 11.1 (*)    HCT 32.3 (*)  Neutro Abs 16.1 (*)    Abs Immature Granulocytes 0.10 (*)    All other components within normal limits  COMPREHENSIVE METABOLIC PANEL - Abnormal; Notable for the following components:   Glucose, Bld 121 (*)    Creatinine, Ser 1.18 (*)    GFR, Estimated 48 (*)    All other components within normal limits  URINALYSIS, ROUTINE W REFLEX MICROSCOPIC - Abnormal; Notable for the following components:   Hgb urine dipstick MODERATE (*)    Leukocytes,Ua SMALL (*)    All other components within normal limits  URINALYSIS, MICROSCOPIC (REFLEX) - Abnormal; Notable for the following components:   Bacteria, UA FEW (*)    All other components within normal limits  URINE CULTURE  C DIFFICILE QUICK SCREEN W PCR REFLEX    LIPASE, BLOOD    EKG EKG Interpretation  Date/Time:  Saturday October 07 2020 18:45:42 EDT Ventricular Rate:  78 PR Interval:  126 QRS Duration: 72 QT Interval:  386 QTC Calculation: 440 R Axis:   -8 Text Interpretation: Normal sinus rhythm Normal ECG No significant change since prior 8/20 Confirmed by Aletta Edouard [phone]) on 10/07/2020 6:47:47 PM  Radiology CT ABDOMEN PELVIS W CONTRAST  Result Date: 10/07/2020 CLINICAL DATA:  Acute abdominal pain.  Fever. EXAM: CT ABDOMEN AND PELVIS WITH CONTRAST TECHNIQUE: Multidetector CT imaging of the abdomen and pelvis was performed using the standard protocol following bolus administration of intravenous contrast. CONTRAST:  77mL OMNIPAQUE IOHEXOL 300  MG/ML  SOLN COMPARISON:  CT 04/06/2019 FINDINGS: Lower chest: No acute airspace disease. No pleural effusion. Heart is normal in size. Hepatobiliary: No focal liver abnormality is seen. No gallstones, gallbladder wall thickening, or biliary dilatation. Pancreas: Again seen fatty atrophy. No ductal dilatation or inflammation. Spleen: Normal in size without focal abnormality. Adrenals/Urinary Tract: Normal adrenal glands. Homogeneous enhancement. Symmetric excretion on delayed phase imaging. There is slight prominence of the right renal pelvis and ureter, however no evidence of obstructing lesion or stone, and excretion appears symmetric to the left. No focal renal lesion. Partially distended urinary bladder. No bladder wall thickening. Stomach/Bowel: Small hiatal hernia. The stomach is decompressed there is no small bowel obstruction. No small bowel inflammation. Edematous wall thickening of the cecum and ascending colon with pericolonic edema. Colonic wall thickening extends to the level of the proximal transverse. There is no pneumatosis. Intraluminal fluid/liquid bowel contents. Transverse colon is redundant. Colonic diverticulosis without focal diverticulitis. Diverticular changes most prominent in the sigmoid colon. Vascular/Lymphatic: Moderate aortic atherosclerosis. No aortic aneurysm. Patent portal vein. Patent mesenteric vessels. Few prominent ileocolic and pericecal lymph nodes are likely reactive. No pelvic adenopathy. Reproductive: Hysterectomy.  No adnexal mass. Other: Trace free fluid in the pelvis is likely reactive. There is no free air. No abdominal wall hernia. Musculoskeletal: There are no acute or suspicious osseous abnormalities. Hemangioma within L1 vertebral body. IMPRESSION: 1. Edematous wall thickening of the cecum and ascending colon with pericolonic edema, consistent with colitis, likely infectious or inflammatory. 2. Distal colonic diverticulosis without focal diverticulitis. Aortic  Atherosclerosis (ICD10-I70.0). Electronically Signed   By: Keith Rake M.D.   On: 10/07/2020 20:29    Procedures Procedures   Medications Ordered in ED Medications  acetaminophen (TYLENOL) tablet 650 mg (has no administration in time range)  ondansetron (ZOFRAN) injection 4 mg (4 mg Intravenous Given 10/07/20 1928)  sodium chloride 0.9 % bolus 1,000 mL (0 mLs Intravenous Stopped 10/07/20 2051)  iohexol (OMNIPAQUE) 300 MG/ML solution 75 mL (75 mLs Intravenous Contrast Given 10/07/20 2002)  prochlorperazine (COMPAZINE) injection 10 mg (10 mg Intravenous Given 10/07/20 2150)    ED Course  I have reviewed the triage vital signs and the nursing notes.  Pertinent labs & imaging results that were available during my care of the patient were reviewed by me and considered in my medical decision making (see chart for details).  Clinical Course as of 10/07/20 2227  Sat Oct 07, 2020  1945 She is presenting with on and off vomiting for the past few weeks but been more consistent over the last few days.  Associated with some diarrhea and generalized abdominal pain.  Diffusely tender on exam.  Soft.  Getting labs urinalysis and CT imaging.  Disposition per results of testing. [MB]  2013 WBC(!): 18.4 [JS]  2125 Bacteria, UA(!): FEW [JS]  2126 Chalmers Guest): SMALL [JS]    Clinical Course User Index [JS] Janeece Fitting, PA-C [MB] Hayden Rasmussen, MD   MDM Rules/Calculators/A&P    Patient with no pertinent past medical history presents to the ED with iron of vomiting for the past week.  Worsening in nature in the last couple days along with some diarrhea.  States anything she actually eats she vomits right away since last night.  Has also had a fever with a T-max of 100, took some Tylenol around 4 PM this evening.  Continues to complain of generalized abdominal pain mostly on the upper abdomen.  Prior history of appendectomy, and partial hysterectomy.  During my evaluation,, patient is overall  ill-appearing, appears weak.  Abdomen is tender to palpation along the lower abdomen and upper quadrants, bowel sounds are increased, there is some guarding present.  There is no CVA tenderness.  Lungs are clear to auscultation.  No swelling, rest of the exam is unremarkable. Provided with Zofran, bolus, To help with symptomatic control.  She does report finishing antibiotics approximately 2 weeks ago, some concern for C. difficile as well.  Last bowel movement is consistent with watery diarrhea but no blood in her stool an hour prior to arrival.  Rotation of her labs reveal a CBC with a leukocytosis of 18,000, hemoglobin slightly decreased at 11.1 but this is within patient's baseline.  CMP without any electrolyte derangement despite multiple episodes of vomiting and diarrhea.  Kidney function is improved from a year ago, does have stage III CKD.  GFR is 48 today, will proceed with CT abdomen and pelvis to rule out any intra-abdominal pathology.  Lipase levels normal.  CT Abdomen and pelvis: 1. Edematous wall thickening of the cecum and ascending colon with  pericolonic edema, consistent with colitis, likely infectious or  inflammatory.  2. Distal colonic diverticulosis without focal diverticulitis.     Aortic Atherosclerosis (ICD10-I70.0).      A C.diff panel has been ordered for patient at this time. Shared decision making with patient who would like to trial outpatient treatment at this time.  We discussed fluid challenge, will also provide her with Tylenol along with recheck of her temperature.  10:27 PM Spoke Dr. Nevada Crane hospitalist who recommended admission into the hospital at this time.  We were able to collect a stool sample on today's visit, we will hold off on antibiotics at this time as patient will need vancomycin to treat a possible C. difficile.  Patient remains hemodynamically stable.   Portions of this note were generated with Lobbyist. Dictation errors may occur  despite best attempts at proofreading.  Final Clinical Impression(s) / ED Diagnoses Final diagnoses:  Nausea vomiting and diarrhea  Rx / DC Orders ED Discharge Orders     None        Janeece Fitting, Hershal Coria 10/07/20 2228    Hayden Rasmussen, MD 10/08/20 1036

## 2020-10-07 NOTE — ED Notes (Signed)
Let PT know urine is needed, stated they wanted to "wait a little bit."

## 2020-10-07 NOTE — ED Notes (Signed)
Pt began having emesis prior to RN giving drink and PO medication.  She also had 1 episode of very loose stool.

## 2020-10-08 DIAGNOSIS — N390 Urinary tract infection, site not specified: Secondary | ICD-10-CM | POA: Diagnosis not present

## 2020-10-08 DIAGNOSIS — R112 Nausea with vomiting, unspecified: Secondary | ICD-10-CM | POA: Diagnosis not present

## 2020-10-08 DIAGNOSIS — D649 Anemia, unspecified: Secondary | ICD-10-CM | POA: Diagnosis present

## 2020-10-08 DIAGNOSIS — Z7983 Long term (current) use of bisphosphonates: Secondary | ICD-10-CM | POA: Diagnosis not present

## 2020-10-08 DIAGNOSIS — Z90711 Acquired absence of uterus with remaining cervical stump: Secondary | ICD-10-CM | POA: Diagnosis not present

## 2020-10-08 DIAGNOSIS — E871 Hypo-osmolality and hyponatremia: Secondary | ICD-10-CM | POA: Diagnosis present

## 2020-10-08 DIAGNOSIS — I129 Hypertensive chronic kidney disease with stage 1 through stage 4 chronic kidney disease, or unspecified chronic kidney disease: Secondary | ICD-10-CM | POA: Diagnosis present

## 2020-10-08 DIAGNOSIS — Z91018 Allergy to other foods: Secondary | ICD-10-CM | POA: Diagnosis not present

## 2020-10-08 DIAGNOSIS — N1832 Chronic kidney disease, stage 3b: Secondary | ICD-10-CM | POA: Diagnosis present

## 2020-10-08 DIAGNOSIS — B9620 Unspecified Escherichia coli [E. coli] as the cause of diseases classified elsewhere: Secondary | ICD-10-CM | POA: Diagnosis present

## 2020-10-08 DIAGNOSIS — M810 Age-related osteoporosis without current pathological fracture: Secondary | ICD-10-CM | POA: Diagnosis present

## 2020-10-08 DIAGNOSIS — G4733 Obstructive sleep apnea (adult) (pediatric): Secondary | ICD-10-CM | POA: Diagnosis present

## 2020-10-08 DIAGNOSIS — Z882 Allergy status to sulfonamides status: Secondary | ICD-10-CM | POA: Diagnosis not present

## 2020-10-08 DIAGNOSIS — Z9049 Acquired absence of other specified parts of digestive tract: Secondary | ICD-10-CM | POA: Diagnosis not present

## 2020-10-08 DIAGNOSIS — E872 Acidosis: Secondary | ICD-10-CM | POA: Diagnosis present

## 2020-10-08 DIAGNOSIS — K5730 Diverticulosis of large intestine without perforation or abscess without bleeding: Secondary | ICD-10-CM | POA: Diagnosis present

## 2020-10-08 DIAGNOSIS — E7800 Pure hypercholesterolemia, unspecified: Secondary | ICD-10-CM | POA: Diagnosis present

## 2020-10-08 DIAGNOSIS — Z8616 Personal history of COVID-19: Secondary | ICD-10-CM | POA: Diagnosis not present

## 2020-10-08 DIAGNOSIS — Z20822 Contact with and (suspected) exposure to covid-19: Secondary | ICD-10-CM | POA: Diagnosis present

## 2020-10-08 DIAGNOSIS — N179 Acute kidney failure, unspecified: Secondary | ICD-10-CM | POA: Diagnosis present

## 2020-10-08 DIAGNOSIS — Z79899 Other long term (current) drug therapy: Secondary | ICD-10-CM | POA: Diagnosis not present

## 2020-10-08 DIAGNOSIS — K529 Noninfective gastroenteritis and colitis, unspecified: Secondary | ICD-10-CM | POA: Diagnosis not present

## 2020-10-08 DIAGNOSIS — E876 Hypokalemia: Secondary | ICD-10-CM | POA: Diagnosis present

## 2020-10-08 DIAGNOSIS — A09 Infectious gastroenteritis and colitis, unspecified: Secondary | ICD-10-CM | POA: Diagnosis present

## 2020-10-08 DIAGNOSIS — R197 Diarrhea, unspecified: Secondary | ICD-10-CM | POA: Diagnosis not present

## 2020-10-08 LAB — COMPREHENSIVE METABOLIC PANEL
ALT: 16 U/L (ref 0–44)
AST: 23 U/L (ref 15–41)
Albumin: 3.5 g/dL (ref 3.5–5.0)
Alkaline Phosphatase: 54 U/L (ref 38–126)
Anion gap: 8 (ref 5–15)
BUN: 18 mg/dL (ref 8–23)
CO2: 22 mmol/L (ref 22–32)
Calcium: 8.7 mg/dL — ABNORMAL LOW (ref 8.9–10.3)
Chloride: 107 mmol/L (ref 98–111)
Creatinine, Ser: 1.26 mg/dL — ABNORMAL HIGH (ref 0.44–1.00)
GFR, Estimated: 45 mL/min — ABNORMAL LOW (ref >60)
Glucose, Bld: 114 mg/dL — ABNORMAL HIGH (ref 70–99)
Potassium: 3.9 mmol/L (ref 3.5–5.1)
Sodium: 137 mmol/L (ref 135–145)
Total Bilirubin: 0.7 mg/dL (ref 0.3–1.2)
Total Protein: 6.5 g/dL (ref 6.5–8.1)

## 2020-10-08 LAB — C DIFFICILE QUICK SCREEN W PCR REFLEX
C Diff antigen: NEGATIVE
C Diff interpretation: NOT DETECTED
C Diff toxin: NEGATIVE

## 2020-10-08 LAB — CBC WITH DIFFERENTIAL/PLATELET
Abs Immature Granulocytes: 1.3 10*3/uL — ABNORMAL HIGH (ref 0.00–0.07)
Band Neutrophils: 25 %
Basophils Absolute: 0 10*3/uL (ref 0.0–0.1)
Basophils Relative: 0 %
Eosinophils Absolute: 0 10*3/uL (ref 0.0–0.5)
Eosinophils Relative: 0 %
HCT: 34.6 % — ABNORMAL LOW (ref 36.0–46.0)
Hemoglobin: 11.4 g/dL — ABNORMAL LOW (ref 12.0–15.0)
Lymphocytes Relative: 5 %
Lymphs Abs: 0.9 10*3/uL (ref 0.7–4.0)
MCH: 31.1 pg (ref 26.0–34.0)
MCHC: 32.9 g/dL (ref 30.0–36.0)
MCV: 94.3 fL (ref 80.0–100.0)
Metamyelocytes Relative: 6 %
Monocytes Absolute: 0.4 10*3/uL (ref 0.1–1.0)
Monocytes Relative: 2 %
Myelocytes: 1 %
Neutro Abs: 16.2 10*3/uL — ABNORMAL HIGH (ref 1.7–7.7)
Neutrophils Relative %: 61 %
Platelets: 248 10*3/uL (ref 150–400)
RBC: 3.67 MIL/uL — ABNORMAL LOW (ref 3.87–5.11)
RDW: 12.5 % (ref 11.5–15.5)
WBC: 18.8 10*3/uL — ABNORMAL HIGH (ref 4.0–10.5)
nRBC: 0 % (ref 0.0–0.2)

## 2020-10-08 LAB — PROCALCITONIN: Procalcitonin: 2.81 ng/mL

## 2020-10-08 LAB — RESP PANEL BY RT-PCR (FLU A&B, COVID) ARPGX2
Influenza A by PCR: NEGATIVE
Influenza B by PCR: NEGATIVE
SARS Coronavirus 2 by RT PCR: NEGATIVE

## 2020-10-08 LAB — MAGNESIUM: Magnesium: 1.6 mg/dL — ABNORMAL LOW (ref 1.7–2.4)

## 2020-10-08 LAB — GLUCOSE, CAPILLARY: Glucose-Capillary: 114 mg/dL — ABNORMAL HIGH (ref 70–99)

## 2020-10-08 LAB — C-REACTIVE PROTEIN: CRP: 23.1 mg/dL — ABNORMAL HIGH (ref <1.0)

## 2020-10-08 LAB — LACTIC ACID, PLASMA: Lactic Acid, Venous: 0.7 mmol/L (ref 0.5–1.9)

## 2020-10-08 LAB — TSH: TSH: 0.601 u[IU]/mL (ref 0.350–4.500)

## 2020-10-08 LAB — PHOSPHORUS: Phosphorus: 3.3 mg/dL (ref 2.5–4.6)

## 2020-10-08 MED ORDER — PROBIOTIC PO TBEC: DELAYED_RELEASE_TABLET | Freq: Every day | ORAL | Status: DC

## 2020-10-08 MED ORDER — PANTOPRAZOLE SODIUM 40 MG PO TBEC
40.0000 mg | DELAYED_RELEASE_TABLET | Freq: Every day | ORAL | Status: DC
Start: 2020-10-08 — End: 2020-10-11
  Administered 2020-10-08 – 2020-10-11 (×4): 40 mg via ORAL
  Filled 2020-10-08 (×4): qty 1

## 2020-10-08 MED ORDER — SODIUM CHLORIDE 0.9% FLUSH
3.0000 mL | Freq: Two times a day (BID) | INTRAVENOUS | Status: DC
  Administered 2020-10-08 – 2020-10-11 (×7): 3 mL via INTRAVENOUS

## 2020-10-08 MED ORDER — ACETAMINOPHEN 650 MG RE SUPP: 650.0000 mg | Freq: Four times a day (QID) | RECTAL | Status: DC | PRN

## 2020-10-08 MED ORDER — HEPARIN SODIUM (PORCINE) 5000 UNIT/ML IJ SOLN
5000.0000 [IU] | Freq: Three times a day (TID) | INTRAMUSCULAR | Status: DC
  Administered 2020-10-08 – 2020-10-11 (×8): 5000 [IU] via SUBCUTANEOUS
  Filled 2020-10-08 (×8): qty 1

## 2020-10-08 MED ORDER — ONDANSETRON HCL 4 MG/2ML IJ SOLN
4.0000 mg | Freq: Four times a day (QID) | INTRAMUSCULAR | Status: DC | PRN
  Administered 2020-10-11: 4 mg via INTRAVENOUS
  Filled 2020-10-08: qty 2

## 2020-10-08 MED ORDER — RISAQUAD PO CAPS
1.0000 | ORAL_CAPSULE | Freq: Every day | ORAL | Status: DC
  Administered 2020-10-08 – 2020-10-11 (×4): 1 via ORAL
  Filled 2020-10-08 (×4): qty 1

## 2020-10-08 MED ORDER — SODIUM CHLORIDE 0.9 % IV SOLN: INTRAVENOUS | Status: AC

## 2020-10-08 MED ORDER — ACETAMINOPHEN 325 MG PO TABS
650.0000 mg | ORAL_TABLET | Freq: Four times a day (QID) | ORAL | Status: DC | PRN
  Administered 2020-10-08 – 2020-10-09 (×2): 650 mg via ORAL
  Filled 2020-10-08 (×2): qty 2

## 2020-10-08 MED ORDER — SODIUM CHLORIDE 0.9 % IV SOLN
2.0000 g | Freq: Every day | INTRAVENOUS | Status: DC
  Administered 2020-10-08 – 2020-10-11 (×4): 2 g via INTRAVENOUS
  Filled 2020-10-08: qty 20
  Filled 2020-10-08: qty 2
  Filled 2020-10-08 (×3): qty 20

## 2020-10-08 MED ORDER — MAGNESIUM SULFATE 2 GM/50ML IV SOLN
2.0000 g | Freq: Once | INTRAVENOUS | Status: AC
  Administered 2020-10-08: 2 g via INTRAVENOUS
  Filled 2020-10-08: qty 50

## 2020-10-08 MED ORDER — METRONIDAZOLE 500 MG/100ML IV SOLN
500.0000 mg | Freq: Three times a day (TID) | INTRAVENOUS | Status: DC
  Administered 2020-10-08 – 2020-10-11 (×9): 500 mg via INTRAVENOUS
  Filled 2020-10-08 (×9): qty 100

## 2020-10-08 MED ORDER — MORPHINE SULFATE (PF) 2 MG/ML IV SOLN
2.0000 mg | INTRAVENOUS | Status: DC | PRN
Start: 2020-10-08 — End: 2020-10-11
  Administered 2020-10-10: 2 mg via INTRAVENOUS
  Filled 2020-10-08: qty 1

## 2020-10-08 MED ORDER — ONDANSETRON HCL 4 MG PO TABS: 4.0000 mg | ORAL_TABLET | Freq: Four times a day (QID) | ORAL | Status: DC | PRN

## 2020-10-08 NOTE — H&P (Signed)
History and Physical    Samantha Owen GDJ:[id number] DOB: 1945/07/24 DOA: 10/07/2020  PCP: Lowella Dandy, NP  Patient coming from: [facility] I have personally briefly reviewed patient's old medical records in [place]  Chief Complaint: n/v/d x  worse x 3 days  HPI: Samantha Owen is a 75 y.o. female with medical history significant of  CKDIII, HTN,HLD, OA, recent interim hx of COVID infection 4 weeks ago who presents to ed due to progressive n/v/d x 3 days. Patient states over the last few weeks she  had constipation which she states she thought was related to iron supplement. She states when this occurs she take fiber tabs with good relief. However per daughter since having covid she has n/v/gi distress for around 3-4 weeks and the symptoms  have noticeably gotten worse over the last 3 days with increase n/v and increase diarrhea as well as abdominal pain and inability tolerate fluids over the last 24 hours. Due to this she brought mother in for evaluation. At home patient also had fevers and chills. But notes no chest pain , sob, cough, or sore throat. Patient on further ros denies any blood in stools or black stools. She also denies previous episode like this in the past.   ED Course:  Temp 100.2, bp 130/90, hr79 rr 20 EKG: Nsr Labs: 18.4, hgb 11.1(at baseline)plt 284 NA:135, gly 121, cr 1.18/1.26 close to baseline Lipase 21 Ua neg Tx zofran , ivfs Ctscan: IMPRESSION: 1. Edematous wall thickening of the cecum and ascending colon with pericolonic edema, consistent with colitis, likely infectious or inflammatory. 2. Distal colonic diverticulosis without focal diverticulitis.  C-dif:neg  Covid :neg Mag 1.6   Aortic Atherosclerosis (ICD10-I70.0).   Review of Systems: As per HPI otherwise 10 point review of systems negative.   Past Medical History:  Diagnosis Date   CKD (chronic kidney disease) stage 3, GFR 30-59 ml/min (HCC)    Hypercholesteremia     Hypertension    Osteoporosis     Past Surgical History:  Procedure Laterality Date   ABDOMINAL HYSTERECTOMY     APPENDECTOMY       reports that she has never smoked. She has never used smokeless tobacco. She reports current alcohol use. She reports that she does not use drugs.  Allergies  Allergen Reactions   Apple Other (See Comments)    Makes her feel bad    Other     Chicken,milk makes her sick    Family History  Problem Relation Age of Onset   Diabetes Father 30   Dementia Other        siblings, multiple   Diabetes Daughter    Stroke Neg Hx    Prior to Admission medications   Medication Sig Start Date End Date Taking? Authorizing Provider  alendronate (FOSAMAX) 70 MG tablet Take 70 mg by mouth once a week. Take with a full glass of water on an empty stomach.    [provider]  amLODipine (NORVASC) 5 MG tablet Take 1 tablet (5 mg total) by mouth daily. 02/21/19 03/23/19  Darliss Cheney, MD  atorvastatin (LIPITOR) 20 MG tablet Take 20 mg by mouth daily.    [provider]  Bisacodyl (LAXATIVE PO) Take 1 tablet by mouth daily.    [provider]  cholecalciferol (VITAMIN D3) 25 MCG (1000 UT) tablet Take 1,000 Units by mouth daily.    [provider]  ciprofloxacin (CIPRO) 250 MG tablet Take 250 mg by mouth  2 (two) times daily. 02/17/19   [provider]  Fe Fum-Fe Poly-Vit C-Lactobac (FUSION PO) Take 1 capsule by mouth daily.    [provider]  gabapentin (NEURONTIN) 100 MG capsule Take 1 tablets 3 times daily for 1 week and then take 2 tablets 3 times daily after that. 02/21/19   Darliss Cheney, MD  metoCLOPramide (REGLAN) 10 MG tablet Take 10 mg by mouth every 6 (six) hours as needed for nausea/vomiting. 02/12/19   [provider]  omeprazole (PRILOSEC) 40 MG capsule Take 40 mg by mouth daily. 02/11/19   [provider]  ondansetron (ZOFRAN) 4 MG tablet Take 4 mg by mouth 3 (three) times daily as needed  for nausea/vomiting. 02/12/19   [provider]  Probiotic Product (PROBIOTIC PO) Take 1 tablet by mouth daily.    [provider]  lisinopril-hydrochlorothiazide (ZESTORETIC) 10-12.5 MG tablet Take 1 tablet by mouth daily.  10/10/18  [provider]    Physical Exam: Vitals:   10/08/20 0421 10/08/20 0630 10/08/20 0717 10/08/20 0730  BP: 102/76 (!) 117/50 93/65 (!) 131/49  Pulse: 77 76 77 71  Resp: 18 18 16 16   Temp: 98.9 F (37.2 C)     TempSrc: Oral     SpO2: 98% 94% 95% 96%  Weight:      Height:         Vitals:   10/08/20 0421 10/08/20 0630 10/08/20 0717 10/08/20 0730  BP: 102/76 (!) 117/50 93/65 (!) 131/49  Pulse: 77 76 77 71  Resp: 18 18 16 16   Temp: 98.9 F (37.2 C)     TempSrc: Oral     SpO2: 98% 94% 95% 96%  Weight:      Height:      Constitutional: NAD, calm, comfortable Eyes: PERRL, lids and conjunctivae normal ENMT: Mucous membranes are dry. Posterior pharynx clear of any exudate or lesions.Normal dentition.  Neck: normal, supple, no masses, no thyromegaly Respiratory: clear to auscultation bilaterally, no wheezing, no crackles. Normal respiratory effort. No accessory muscle use.  Cardiovascular: Regular rate and rhythm, no murmurs / rubs / gallops. No extremity edema. 2+ pedal pulses. No carotid bruits.  Abdomen: +mid abd tenderness and rlq, no masses palpated. No hepatosplenomegaly. Bowel sounds positive.  Musculoskeletal: no clubbing / cyanosis. No joint deformity upper and lower extremities. Good ROM, no contractures. Normal muscle tone.  Skin: no rashes, lesions, ulcers. No induration Neurologic: CN 2-12 grossly intact. Sensation intact,  Strength 5/5 in all 4.  Psychiatric: Normal judgment and insight. Alert and oriented . Normal mood.    Labs on Admission: I have personally reviewed following labs and imaging studies  CBC: Recent Labs  Lab 10/07/20 1906  WBC 18.4*  NEUTROABS 16.1*  HGB 11.1*  HCT 32.3*  MCV 90.5  PLT  629   Basic Metabolic Panel: Recent Labs  Lab 10/07/20 1906  NA 135  K 4.2  CL 103  CO2 23  GLUCOSE 121*  BUN 16  CREATININE 1.18*  CALCIUM 8.9   GFR: Estimated Creatinine Clearance: 35.6 mL/min (A) (by C-G formula based on SCr of 1.18 mg/dL (H)). Liver Function Tests: Recent Labs  Lab 10/07/20 1906  AST 24  ALT 16  ALKPHOS 52  BILITOT 0.6  PROT 6.6  ALBUMIN 3.7   Recent Labs  Lab 10/07/20 1906  LIPASE 21   No results for input(s): AMMONIA in the last 168 hours. Coagulation Profile: No results for input(s): INR, PROTIME in the last 168 hours. Cardiac Enzymes:  No results for input(s): CKTOTAL, CKMB, CKMBINDEX, TROPONINI in the last 168 hours. BNP (last 3 results) No results for input(s): PROBNP in the last 8760 hours. HbA1C: No results for input(s): HGBA1C in the last 72 hours. CBG: No results for input(s): GLUCAP in the last 168 hours. Lipid Profile: No results for input(s): CHOL, HDL, LDLCALC, TRIG, CHOLHDL, LDLDIRECT in the last 72 hours. Thyroid Function Tests: No results for input(s): TSH, T4TOTAL, FREET4, T3FREE, THYROIDAB in the last 72 hours. Anemia Panel: No results for input(s): VITAMINB12, FOLATE, FERRITIN, TIBC, IRON, RETICCTPCT in the last 72 hours. Urine analysis:    Component Value Date/Time   COLORURINE YELLOW 10/07/2020 1906   APPEARANCEUR CLEAR 10/07/2020 1906   LABSPEC 1.015 10/07/2020 1906   PHURINE 6.0 10/07/2020 1906   GLUCOSEU NEGATIVE 10/07/2020 1906   HGBUR MODERATE (A) 10/07/2020 1906   BILIRUBINUR NEGATIVE 10/07/2020 1906   KETONESUR NEGATIVE 10/07/2020 1906   PROTEINUR NEGATIVE 10/07/2020 1906   NITRITE NEGATIVE 10/07/2020 1906   LEUKOCYTESUR SMALL (A) 10/07/2020 1906    Radiological Exams on Admission: CT ABDOMEN PELVIS W CONTRAST  Result Date: 10/07/2020 CLINICAL DATA:  Acute abdominal pain.  Fever. EXAM: CT ABDOMEN AND PELVIS WITH CONTRAST TECHNIQUE: Multidetector CT imaging of the abdomen and pelvis was performed  using the standard protocol following bolus administration of intravenous contrast. CONTRAST:  71mL OMNIPAQUE IOHEXOL 300 MG/ML  SOLN COMPARISON:  CT 04/06/2019 FINDINGS: Lower chest: No acute airspace disease. No pleural effusion. Heart is normal in size. Hepatobiliary: No focal liver abnormality is seen. No gallstones, gallbladder wall thickening, or biliary dilatation. Pancreas: Again seen fatty atrophy. No ductal dilatation or inflammation. Spleen: Normal in size without focal abnormality. Adrenals/Urinary Tract: Normal adrenal glands. Homogeneous enhancement. Symmetric excretion on delayed phase imaging. There is slight prominence of the right renal pelvis and ureter, however no evidence of obstructing lesion or stone, and excretion appears symmetric to the left. No focal renal lesion. Partially distended urinary bladder. No bladder wall thickening. Stomach/Bowel: Small hiatal hernia. The stomach is decompressed there is no small bowel obstruction. No small bowel inflammation. Edematous wall thickening of the cecum and ascending colon with pericolonic edema. Colonic wall thickening extends to the level of the proximal transverse. There is no pneumatosis. Intraluminal fluid/liquid bowel contents. Transverse colon is redundant. Colonic diverticulosis without focal diverticulitis. Diverticular changes most prominent in the sigmoid colon. Vascular/Lymphatic: Moderate aortic atherosclerosis. No aortic aneurysm. Patent portal vein. Patent mesenteric vessels. Few prominent ileocolic and pericecal lymph nodes are likely reactive. No pelvic adenopathy. Reproductive: Hysterectomy.  No adnexal mass. Other: Trace free fluid in the pelvis is likely reactive. There is no free air. No abdominal wall hernia. Musculoskeletal: There are no acute or suspicious osseous abnormalities. Hemangioma within L1 vertebral body. IMPRESSION: 1. Edematous wall thickening of the cecum and ascending colon with pericolonic edema, consistent  with colitis, likely infectious or inflammatory. 2. Distal colonic diverticulosis without focal diverticulitis. Aortic Atherosclerosis (ICD10-I70.0). Electronically Signed   By: Keith Rake M.D.   On: 10/07/2020 20:29    EKG: Independently reviewed. See above  Assessment/Plan Acute Colitis  -c-dif negative  -full gi panel pending  -abx/metronidazole/ctx  -f/u blood cultures inflammatory markers /fecal calprotectin - gi consult in am ( please call)  -supportive care ivfs /pain meds   CKDIII -currently close to baseline -monitor I/o closely  -continue ivfs to above aki -hold nephrotoxic medications  HTN -soft bp  -holding bp meds currently  -resume in am as able   HLD -hold statin  -  resume once tolerating po   OA -supportive care   DVT prophylaxis: lwmh Code Status: FULL Family Communication: dtr at bedside [place] (Daughter)  [phone] (Mobile Disposition Plan: patient  expected to be admitted greater than 2 midnights  Consults called: please call gi consult in am  Admission status: med floor    Clance Boll MD [facility]  If 7PM-7AM, please contact night-coverage [email or web address] Password [facility]  10/08/2020, 10:17 AM

## 2020-10-08 NOTE — ED Notes (Signed)
Pt was given water with ice, disposable scrub pants, and disposable underwear.

## 2020-10-09 LAB — CBC
HCT: 35.3 % — ABNORMAL LOW (ref 36.0–46.0)
HCT: 30.5 % — ABNORMAL LOW (ref 36.0–46.0)
Hemoglobin: 11.7 g/dL — ABNORMAL LOW (ref 12.0–15.0)
Hemoglobin: 9.9 g/dL — ABNORMAL LOW (ref 12.0–15.0)
MCH: 30.8 pg (ref 26.0–34.0)
MCH: 30.7 pg (ref 26.0–34.0)
MCHC: 33.1 g/dL (ref 30.0–36.0)
MCHC: 32.5 g/dL (ref 30.0–36.0)
MCV: 92.9 fL (ref 80.0–100.0)
MCV: 94.7 fL (ref 80.0–100.0)
Platelets: 59 10*3/uL — ABNORMAL LOW (ref 150–400)
Platelets: 201 10*3/uL (ref 150–400)
RBC: 3.8 MIL/uL — ABNORMAL LOW (ref 3.87–5.11)
RBC: 3.22 MIL/uL — ABNORMAL LOW (ref 3.87–5.11)
RDW: 12.6 % (ref 11.5–15.5)
RDW: 12.4 % (ref 11.5–15.5)
WBC: 11.6 10*3/uL — ABNORMAL HIGH (ref 4.0–10.5)
WBC: 11.2 10*3/uL — ABNORMAL HIGH (ref 4.0–10.5)
nRBC: 0 % (ref 0.0–0.2)
nRBC: 0 % (ref 0.0–0.2)

## 2020-10-09 LAB — COMPREHENSIVE METABOLIC PANEL
ALT: 16 U/L (ref 0–44)
AST: 27 U/L (ref 15–41)
Albumin: 3.3 g/dL — ABNORMAL LOW (ref 3.5–5.0)
Alkaline Phosphatase: 56 U/L (ref 38–126)
Anion gap: 9 (ref 5–15)
BUN: 16 mg/dL (ref 8–23)
CO2: 19 mmol/L — ABNORMAL LOW (ref 22–32)
Calcium: 8.7 mg/dL — ABNORMAL LOW (ref 8.9–10.3)
Chloride: 106 mmol/L (ref 98–111)
Creatinine, Ser: 1.19 mg/dL — ABNORMAL HIGH (ref 0.44–1.00)
GFR, Estimated: 48 mL/min — ABNORMAL LOW (ref >60)
Glucose, Bld: 108 mg/dL — ABNORMAL HIGH (ref 70–99)
Potassium: 3.4 mmol/L — ABNORMAL LOW (ref 3.5–5.1)
Sodium: 134 mmol/L — ABNORMAL LOW (ref 135–145)
Total Bilirubin: 0.5 mg/dL (ref 0.3–1.2)
Total Protein: 6.3 g/dL — ABNORMAL LOW (ref 6.5–8.1)

## 2020-10-09 LAB — PROCALCITONIN: Procalcitonin: 2.65 ng/mL

## 2020-10-09 LAB — URINE CULTURE: Culture: NO GROWTH

## 2020-10-09 MED ORDER — DICYCLOMINE HCL 10 MG PO CAPS
10.0000 mg | ORAL_CAPSULE | Freq: Three times a day (TID) | ORAL | Status: DC | PRN
  Administered 2020-10-09: 10 mg via ORAL
  Filled 2020-10-09 (×2): qty 1

## 2020-10-09 MED ORDER — HYDROCORTISONE (PERIANAL) 2.5 % EX CREA
1.0000 "application " | TOPICAL_CREAM | Freq: Four times a day (QID) | CUTANEOUS | Status: DC | PRN
  Filled 2020-10-09: qty 28.35

## 2020-10-09 MED ORDER — BOOST / RESOURCE BREEZE PO LIQD CUSTOM
1.0000 | Freq: Three times a day (TID) | ORAL | Status: DC
  Administered 2020-10-09 – 2020-10-11 (×5): 1 via ORAL

## 2020-10-09 NOTE — Progress Notes (Signed)
Initial Nutrition Assessment  INTERVENTION:   -Boost Breeze po TID, each supplement provides 250 kcal and 9 grams of protein  NUTRITION DIAGNOSIS:   Inadequate oral intake related to nausea, vomiting, diarrhea as evidenced by per patient/family report.  GOAL:   Patient will meet greater than or equal to 90% of their needs  MONITOR:   PO intake, Supplement acceptance, Labs, Weight trends, I & O's  REASON FOR ASSESSMENT:   Malnutrition Screening Tool    ASSESSMENT:   75 y.o. female with medical history significant of   CKDIII, HTN,HLD, OA, recent interim hx of COVID infection 4 weeks ago who presents to ed due to progressive n/v/d x 3 days.  Patient continues to have diarrhea today. C.diff was negative.  Pt reports having N/V and diarrhea for 3 days PTA. Prior to that pt was having bouts of constipation which she associated with taking iron supplements. Pt on clears currently, consuming 15-25% of trays. Noted food allergies to milk and chicken as well as apples. Will order Boost Breeze to supplement clear liquid diet.  Per weight records, pt weighed 147 lbs in December 2020. Current weight: 136 lbs.  Medications: Risaquad   Labs reviewed: CBGs: 114 Low Na, K    NUTRITION - FOCUSED PHYSICAL EXAM:  Deferred.  Diet Order:   Diet Order             Diet clear liquid Room service appropriate? Yes; Fluid consistency: Thin  Diet effective now                   EDUCATION NEEDS:   No education needs have been identified at this time  Skin:  Skin Assessment: Reviewed RN Assessment  Last BM:  8/15 -type 7  Height:   Ht Readings from Last 1 Encounters:  10/07/20 5\' 2"  (1.575 m)    Weight:   Wt Readings from Last 1 Encounters:  10/07/20 61.7 kg    BMI:  Body mass index is 24.87 kg/m.  Estimated Nutritional Needs:   Kcal:  1550-1750  Protein:  70-85g  Fluid:  1.8L/day   Clayton Bibles, MS, RD, LDN Inpatient Clinical Dietitian Contact  information available via Amion

## 2020-10-09 NOTE — Progress Notes (Signed)
PROGRESS NOTE    Samantha Owen  WCB:[id number] DOB: 1945-08-19 DOA: 10/07/2020 PCP: Lowella Dandy, NP   Chief Complaint  Patient presents with   Abdominal Pain   Brief Narrative: 75 year old female with CKD stage III, HTN, HLD, OSA, recent interim history of COVID infection 4 weeks ago presented to the ED with progressive nausea, vomiting and diarrhea x3 days prior to admission.  She had constipation last few months, felt to be from iron supplementation and was taking fiber tablets with good relief Patient was seen in the ED low-grade temp 100.2, labs with leukocytosis UA negative CT abdomen showed edematous wall thickening of the cecum and ascending colon with pericolonic edema consistent with colitis likely infectious or inflammatory, distal colonic diverticulosis Patient is admitted on empiric antibiotics C. difficile was negative  Subjective: Patient complains of ongoing loose stool multiple episodes and now having some cramping lower abdomen.  No nausea or vomiting.  Assessment & Plan:  Nausea vomiting diarrhea CT evidence of acute colitis ascending colon and cecum: C. difficile negative follow-up GI panel continue enteric precaution, continue empiric Flagyl and ceftriaxone follow-up inflammatory markers fecal calprotectin, continue IV hydration supportive measures.  Leukocytosis much better. Procal is at 2.6. GI consulted w/ Dr Collene Mares.Add Bentyl for spasm. Recent Labs  Lab 10/07/20 1906 10/08/20 1130 10/08/20 1957 10/08/20 2156 10/09/20 0446 10/09/20 0653  WBC 18.4* 18.8*  --   --  11.6* 11.2*  LATICACIDVEN  --   --   --  0.7  --   --   PROCALCITON  --   --  2.81  --  2.65  --     CKDIIIb: Overall at baseline.  Continue gentle hydration due to diarrhea Recent Labs    10/07/20 1906 10/08/20 1130  BUN 16 18  CREATININE 1.18* 1.26*   Mild hypokalemia monitor and replete Mild hyponatremia again continue to monitor.  Taking p.o. orally. Metabolic acidosis mild  monitor  Hypertension: Holding BP meds for now.  Resume as able  OA supportive care Tylenol  Anemia slight drop in hemoglobin could be from hemodilution.  Repeat CBC in a.m. Recent Labs  Lab 10/07/20 1906 10/08/20 1130 10/09/20 0446 10/09/20 0653  HGB 11.1* 11.4* 11.7* 9.9*  HCT 32.3* 34.6* 35.3* 30.5*    Diet Order             Diet clear liquid Room service appropriate? Yes; Fluid consistency: Thin  Diet effective now                  Patient's Body mass index is 24.87 kg/m. DVT prophylaxis: heparin injection 5,000 Units Start: 10/08/20 2200 Code Status:   Code Status: Full Code  Family Communication: plan of care discussed with patient and her family at bedside. Status is: Inpatient  Remains inpatient appropriate because:Ongoing diagnostic testing needed not appropriate for outpatient work up, IV treatments appropriate due to intensity of illness or inability to take PO, and Inpatient level of care appropriate due to severity of illness  Dispo: The patient is from: Home              Anticipated d/c is to: Home              Patient currently is not medically stable to d/c.   Difficult to place patient No  Unresulted Labs (From admission, onward)     Start     Ordered   10/09/20 0932  Gastrointestinal Panel by PCR , Stool  (Gastrointestinal Panel by PCR, Stool                                                                                                                                                     **  Does Not include CLOSTRIDIUM DIFFICILE testing. **If CDIFF testing is needed, place order from the "C Difficile Testing" order set.**)  Add-on,   AD        10/09/20 0932   10/09/20 0500  Comprehensive metabolic panel  Tomorrow morning,   R        10/08/20 1030   10/09/20 0500  Procalcitonin  Daily,   R      10/08/20 1905   10/08/20 1906  Lactic acid, plasma  STAT Now then every 3 hours,   R (with STAT occurrences)      10/08/20 1905   10/08/20 1905  Calprotectin,  Fecal  Once,   R        10/08/20 1905   10/08/20 1904  Culture, blood (routine x 2)  BLOOD CULTURE X 2,   R (with TIMED occurrences)      10/08/20 1905   10/08/20 1029  Norovirus group 1 & 2 by PCR, stool  (Norovirus group 1 & 2 by PCR, stool panel)  Once,   R        10/08/20 1030   10/08/20 1026  Urine Culture  Once,   R       Question:  Indication  Answer:  Urgency/frequency   10/08/20 1030   10/08/20 1024  CBC  (heparin)  Once,   R       Comments: Baseline for heparin therapy IF NOT ALREADY DRAWN.  Notify MD if PLT < 100 K.    10/08/20 1030           Medications reviewed:  Scheduled Meds:  acidophilus  1 capsule Oral Daily   heparin  5,000 Units Subcutaneous Q8H   pantoprazole  40 mg Oral Daily   sodium chloride flush  3 mL Intravenous Q12H   Continuous Infusions:  sodium chloride 75 mL/hr at 10/08/20 1843   cefTRIAXone (ROCEPHIN)  IV Stopped (10/08/20 1606)   metronidazole 500 mg (10/09/20 0516)   Consultants:see note  Procedures:see note Antimicrobials: Anti-infectives (From admission, onward)    Start     Dose/Rate Route Frequency Ordered Stop   10/08/20 1200  metroNIDAZOLE (FLAGYL) IVPB 500 mg        500 mg 100 mL/hr over 60 Minutes Intravenous Every 8 hours 10/08/20 1030     10/08/20 1100  cefTRIAXone (ROCEPHIN) 2 g in sodium chloride 0.9 % 100 mL IVPB        2 g 200 mL/hr over 30 Minutes Intravenous Daily 10/08/20 1030        Culture/Microbiology    Component Value Date/Time   SDES  10/08/2020 2018    BLOOD RIGHT HAND Performed at [facility], [address], [place], [place] [address]    [place]  10/08/2020 2018    BOTTLES DRAWN AEROBIC AND ANAEROBIC Blood Culture adequate volume Performed at [facility] [address] [place], [place] [address]    CULT PENDING 10/08/2020 2018   REPTSTATUS PENDING 10/08/2020 2018    Other culture-see note  Objective: Vitals: Today's Vitals   10/08/20 1811 10/08/20 1909 10/08/20  2100 10/09/20 0446  BP:   (!) 100/44 (!) 124/53  Pulse:   66 61  Resp:   16 15  Temp: (!) 100.6 F (38.1 C)  99.1 F (37.3 C) 98.4 F (36.9 C)  TempSrc: Axillary  Oral Oral  SpO2:   97% 98%  Weight:      Height:      PainSc: 8  5  Intake/Output Summary (Last 24 hours) at 10/09/2020 1025 Last data filed at 10/09/2020 0900 Gross per 24 hour  Intake 1739.31 ml  Output --  Net 1739.31 ml   Filed Weights   10/07/20 1841  Weight: 61.7 kg   Weight change:   Intake/Output from previous day: 08/14 0701 - 08/15 0700 In: 1503.3 [P.O.:236; I.V.:967.3; IV Piggyback:300] Out: -  Intake/Output this shift: Total I/O In: 236 [P.O.:236] Out: -  Filed Weights   10/07/20 1841  Weight: 61.7 kg   Examination: General exam: AAO x3,older than stated age, weak appearing. HEENT:Oral mucosa moist, Ear/Nose WNL grossly,dentition normal. Respiratory system: bilaterally diminished, no use of accessory muscle, non tender. Cardiovascular system: S1 & S2 +,No JVD. Gastrointestinal system: Abdomen soft, nontender no guarding or rebound tenderness,BS+. Nervous System:Alert, awake, moving extremities Extremities: no edema, distal peripheral pulses palpable.  Skin: No rashes,no icterus. MSK: Normal muscle bulk,tone, power Data Reviewed: I have personally reviewed following labs and imaging studies CBC: Recent Labs  Lab 10/07/20 1906 10/08/20 1130 10/09/20 0446 10/09/20 0653  WBC 18.4* 18.8* 11.6* 11.2*  NEUTROABS 16.1* 16.2*  --   --   HGB 11.1* 11.4* 11.7* 9.9*  HCT 32.3* 34.6* 35.3* 30.5*  MCV 90.5 94.3 92.9 94.7  PLT 284 248 59* 814   Basic Metabolic Panel: Recent Labs  Lab 10/07/20 1906 10/08/20 1130  NA 135 137  K 4.2 3.9  CL 103 107  CO2 23 22  GLUCOSE 121* 114*  BUN 16 18  CREATININE 1.18* 1.26*  CALCIUM 8.9 8.7*  MG  --  1.6*  PHOS  --  3.3   GFR: Estimated Creatinine Clearance: 33.3 mL/min (A) (by C-G formula based on SCr of 1.26 mg/dL (H)). Liver Function  Tests: Recent Labs  Lab 10/07/20 1906 10/08/20 1130  AST 24 23  ALT 16 16  ALKPHOS 52 54  BILITOT 0.6 0.7  PROT 6.6 6.5  ALBUMIN 3.7 3.5   Recent Labs  Lab 10/07/20 1906  LIPASE 21   No results for input(s): AMMONIA in the last 168 hours. Coagulation Profile: No results for input(s): INR, PROTIME in the last 168 hours. Cardiac Enzymes: No results for input(s): CKTOTAL, CKMB, CKMBINDEX, TROPONINI in the last 168 hours. BNP (last 3 results) No results for input(s): PROBNP in the last 8760 hours. HbA1C: No results for input(s): HGBA1C in the last 72 hours. CBG: Recent Labs  Lab 10/08/20 2101  GLUCAP 114*   Lipid Profile: No results for input(s): CHOL, HDL, LDLCALC, TRIG, CHOLHDL, LDLDIRECT in the last 72 hours. Thyroid Function Tests: Recent Labs    10/08/20 1130  TSH 0.601   Anemia Panel: No results for input(s): VITAMINB12, FOLATE, FERRITIN, TIBC, IRON, RETICCTPCT in the last 72 hours. Sepsis Labs: Recent Labs  Lab 10/08/20 1957 10/08/20 2156 10/09/20 0446  PROCALCITON 2.81  --  2.65  LATICACIDVEN  --  0.7  --     Recent Results (from the past 240 hour(s))  Urine Culture     Status: Abnormal (Preliminary result)   Collection Time: 10/07/20  7:06 PM   Specimen: Urine, Random  Result Value Ref Range Status   Specimen Description   Final    URINE, RANDOM Performed at [facility], [address] [place], [place] [address]    Special Requests   Final    NONE Performed at [facility], [address] [place], [place] [address]    Culture (A)  Final    30,000 COLONIES/mL  GRAM NEGATIVE RODS SUSCEPTIBILITIES TO FOLLOW Performed at [facility], [place] [address] [place], [place] [address]    Report Status PENDING  Incomplete  C Difficile Quick Screen w PCR reflex     Status: None   Collection Time: 10/07/20  9:48 PM   Specimen: STOOL  Result Value Ref Range Status   C Diff antigen NEGATIVE NEGATIVE Final   C Diff toxin  NEGATIVE NEGATIVE Final   C Diff interpretation No C. difficile detected.  Final    Comment: Performed at [facility], [place] [address] [place], [place] [address]  Resp Panel by RT-PCR (Flu A&B, Covid) Nasopharyngeal Swab     Status: None   Collection Time: 10/08/20  1:00 AM   Specimen: Nasopharyngeal Swab; Nasopharyngeal(NP) swabs in vial transport medium  Result Value Ref Range Status   SARS Coronavirus 2 by RT PCR NEGATIVE NEGATIVE Final    Comment: (NOTE) SARS-CoV-2 target nucleic acids are NOT DETECTED.  The SARS-CoV-2 RNA is generally detectable in upper respiratory specimens during the acute phase of infection. The lowest concentration of SARS-CoV-2 viral copies this assay can detect is 138 copies/mL. A negative result does not preclude SARS-Cov-2 infection and should not be used as the sole basis for treatment or other patient management decisions. A negative result may occur with  improper specimen collection/handling, submission of specimen other than nasopharyngeal swab, presence of viral mutation(s) within the areas targeted by this assay, and inadequate number of viral copies(<138 copies/mL). A negative result must be combined with clinical observations, patient history, and epidemiological information. The expected result is Negative.  Fact Sheet for Patients:  EntrepreneurPulse.com.au  Fact Sheet for Healthcare Providers:  IncredibleEmployment.be  This test is no t yet approved or cleared by the [place] FDA and  has been authorized for detection and/or diagnosis of SARS-CoV-2 by FDA under an Emergency Use Authorization (EUA). This EUA will remain  in effect (meaning this test can be used) for the duration of the COVID-19 declaration under Section 564(b)(1) of the Act, 21 U.S.C.section 360bbb-3(b)(1), unless the authorization is terminated  or revoked sooner.       Influenza A by PCR NEGATIVE NEGATIVE Final    Influenza B by PCR NEGATIVE NEGATIVE Final    Comment: (NOTE) The Xpert Xpress SARS-CoV-2/FLU/RSV plus assay is intended as an aid in the diagnosis of influenza from Nasopharyngeal swab specimens and should not be used as a sole basis for treatment. Nasal washings and aspirates are unacceptable for Xpert Xpress SARS-CoV-2/FLU/RSV testing.  Fact Sheet for Patients: EntrepreneurPulse.com.au  Fact Sheet for Healthcare Providers: IncredibleEmployment.be  This test is not yet approved or cleared by the [place] FDA and has been authorized for detection and/or diagnosis of SARS-CoV-2 by FDA under an Emergency Use Authorization (EUA). This EUA will remain in effect (meaning this test can be used) for the duration of the COVID-19 declaration under Section 564(b)(1) of the Act, 21 U.S.C. section 360bbb-3(b)(1), unless the authorization is terminated or revoked.  Performed at [facility], [address] [place], [place] [address]   Culture, blood (routine x 2)     Status: None (Preliminary result)   Collection Time: 10/08/20  8:18 PM   Specimen: BLOOD RIGHT HAND  Result Value Ref Range Status   Specimen Description   Final    BLOOD RIGHT HAND Performed at [facility], [place] [address] [place], [place] [address]    Special Requests   Final  BOTTLES DRAWN AEROBIC AND ANAEROBIC Blood Culture adequate volume Performed at [facility] [address], [place], [place] [address]    Culture PENDING  Incomplete   Report Status PENDING  Incomplete     Radiology Studies: CT ABDOMEN PELVIS W CONTRAST  Result Date: 10/07/2020 CLINICAL DATA:  Acute abdominal pain.  Fever. EXAM: CT ABDOMEN AND PELVIS WITH CONTRAST TECHNIQUE: Multidetector CT imaging of the abdomen and pelvis was performed using the standard protocol following bolus administration of intravenous contrast. CONTRAST:  6mL OMNIPAQUE IOHEXOL 300 MG/ML   SOLN COMPARISON:  CT 04/06/2019 FINDINGS: Lower chest: No acute airspace disease. No pleural effusion. Heart is normal in size. Hepatobiliary: No focal liver abnormality is seen. No gallstones, gallbladder wall thickening, or biliary dilatation. Pancreas: Again seen fatty atrophy. No ductal dilatation or inflammation. Spleen: Normal in size without focal abnormality. Adrenals/Urinary Tract: Normal adrenal glands. Homogeneous enhancement. Symmetric excretion on delayed phase imaging. There is slight prominence of the right renal pelvis and ureter, however no evidence of obstructing lesion or stone, and excretion appears symmetric to the left. No focal renal lesion. Partially distended urinary bladder. No bladder wall thickening. Stomach/Bowel: Small hiatal hernia. The stomach is decompressed there is no small bowel obstruction. No small bowel inflammation. Edematous wall thickening of the cecum and ascending colon with pericolonic edema. Colonic wall thickening extends to the level of the proximal transverse. There is no pneumatosis. Intraluminal fluid/liquid bowel contents. Transverse colon is redundant. Colonic diverticulosis without focal diverticulitis. Diverticular changes most prominent in the sigmoid colon. Vascular/Lymphatic: Moderate aortic atherosclerosis. No aortic aneurysm. Patent portal vein. Patent mesenteric vessels. Few prominent ileocolic and pericecal lymph nodes are likely reactive. No pelvic adenopathy. Reproductive: Hysterectomy.  No adnexal mass. Other: Trace free fluid in the pelvis is likely reactive. There is no free air. No abdominal wall hernia. Musculoskeletal: There are no acute or suspicious osseous abnormalities. Hemangioma within L1 vertebral body. IMPRESSION: 1. Edematous wall thickening of the cecum and ascending colon with pericolonic edema, consistent with colitis, likely infectious or inflammatory. 2. Distal colonic diverticulosis without focal diverticulitis. Aortic  Atherosclerosis (ICD10-I70.0). Electronically Signed   By: Keith Rake M.D.   On: 10/07/2020 20:29     LOS: 1 day   Antonieta Pert, MD [facility]  10/09/2020, 10:25 AM

## 2020-10-09 NOTE — Consult Note (Addendum)
UNASSIGNED DPATIENT Reason for Consult: Severe diarrhea and diarrhea with colitis on CT scan. Referring Physician: [facility].  Primary gastroenterologist Dr. Lyda Jester in [place] is an 75 y.o. female.  HPI: Samantha Owen is a 75 year old white female multiple medical problems listed below, recent Covid infection 4 weeks ago, who presents to the hospital with severe diarrhea that started on about 4 days ago. She claims she ate half of a hamburger with some mashed potatoes and her husband had the same meal but he did not get sick. She says she has had intermittent problems with diarrhea, nausea and vomiting that last for couple of days but this time her symptoms did not abate prompting her to come to the emergency room.  She claims she has had 40 bowel movements today and has been going to the bathroom every few minutes. The frequency of bowel movements seem to have decreased over the last hour. There is no history of melena hematochezia. She has severe abdominal cramping with the diarrhea.  She gives a history of having a colonoscopy done by Dr. Lyda Jester a couple of years ago that showed diverticulosis but no colon polyps or masses were described.  I do not have a copy of the procedure report available to me at the present time patient was found to have a low-grade fever in the ED with temperature 100.2 F along with leukocytosis CT of the abdomen showed edematous thick-walled cecum and ascending colon with pericolonic edema consistent with infectious versus inflammatory colitis. Patient was admitted and started on Rocephin and Metronidazole. She admits to consuming large amount of artificial sweeteners on a daily basis. She received some Bentyl this afternoon which is helped her symptoms greatly.  Past Medical History:  Diagnosis Date   CKD (chronic kidney disease) stage 3, GFR 30-59 ml/min (HCC)    Hypercholesteremia    Colonic diverticulosis    Hypertension     Osteoporosis    Obstructive sleep apnea    Past Surgical History:  Procedure Laterality Date   ABDOMINAL HYSTERECTOMY     APPENDECTOMY     Family History  Problem Relation Age of Onset   Diabetes Father 32   Dementia Other        siblings, multiple   Diabetes Daughter    Stroke Neg Hx    Social History:  reports that she has never smoked. She has never used smokeless tobacco. She reports current alcohol use. She reports that she does not use drugs.  Allergies:  Allergies  Allergen Reactions   Apple Other (See Comments)    Makes her feel bad    Other     Chicken,milk makes her sick   Sulfa Antibiotics    Medications: I have reviewed the patient's current medications. Prior to Admission:  Medications Prior to Admission  Medication Sig Dispense Refill Last Dose   alendronate (FOSAMAX) 70 MG tablet Take 70 mg by mouth once a week. Take with a full glass of water on an empty stomach.   09/30/2020   amLODipine (NORVASC) 5 MG tablet Take 5 mg by mouth daily.   Past Week   atorvastatin (LIPITOR) 20 MG tablet Take 20 mg by mouth daily.   Past Week   omeprazole (PRILOSEC) 40 MG capsule Take 40 mg by mouth daily.   Past Week   Probiotic Product (PROBIOTIC PO) Take 1 tablet by mouth daily.   Past Week   amLODipine (NORVASC) 5 MG tablet Take 1 tablet (5 mg total) by mouth  daily. 30 tablet 0    gabapentin (NEURONTIN) 100 MG capsule Take 1 tablets 3 times daily for 1 week and then take 2 tablets 3 times daily after that. (Patient not taking: Reported on 10/08/2020) 90 capsule 0 Completed Course   Scheduled:  acidophilus  1 capsule Oral Daily   feeding supplement  1 Container Oral TID BM   heparin  5,000 Units Subcutaneous Q8H   pantoprazole  40 mg Oral Daily   sodium chloride flush  3 mL Intravenous Q12H   Continuous:  cefTRIAXone (ROCEPHIN)  IV Stopped (10/09/20 1105)   metronidazole Stopped (10/09/20 1250)   Results for orders placed or performed during the hospital encounter of  10/07/20 (from the past 48 hour(s))  CBC with Differential     Status: Abnormal   Collection Time: 10/07/20  7:06 PM  Result Value Ref Range   WBC 18.4 (H) 4.0 - 10.5 K/uL   RBC 3.57 (L) 3.87 - 5.11 MIL/uL   Hemoglobin 11.1 (L) 12.0 - 15.0 g/dL   HCT 32.3 (L) 36.0 - 46.0 %   MCV 90.5 80.0 - 100.0 fL   MCH 31.1 26.0 - 34.0 pg   MCHC 34.4 30.0 - 36.0 g/dL   RDW 12.1 11.5 - 15.5 %   Platelets 284 150 - 400 K/uL   nRBC 0.0 0.0 - 0.2 %   Neutrophils Relative % 87 %   Neutro Abs 16.1 (H) 1.7 - 7.7 K/uL   Lymphocytes Relative 7 %   Lymphs Abs 1.3 0.7 - 4.0 K/uL   Monocytes Relative 5 %   Monocytes Absolute 0.9 0.1 - 1.0 K/uL   Eosinophils Relative 0 %   Eosinophils Absolute 0.0 0.0 - 0.5 K/uL   Basophils Relative 0 %   Basophils Absolute 0.0 0.0 - 0.1 K/uL   Immature Granulocytes 1 %   Abs Immature Granulocytes 0.10 (H) 0.00 - 0.07 K/uL    Comment: Performed at [facility] [address] [place], [place] [address]  Comprehensive metabolic panel     Status: Abnormal   Collection Time: 10/07/20  7:06 PM  Result Value Ref Range   Sodium 135 135 - 145 mmol/L   Potassium 4.2 3.5 - 5.1 mmol/L   Chloride 103 98 - 111 mmol/L   CO2 23 22 - 32 mmol/L   Glucose, Bld 121 (H) 70 - 99 mg/dL    Comment: Glucose reference range applies only to samples taken after fasting for at least 8 hours.   BUN 16 8 - 23 mg/dL   Creatinine, Ser 1.18 (H) 0.44 - 1.00 mg/dL   Calcium 8.9 8.9 - 10.3 mg/dL   Total Protein 6.6 6.5 - 8.1 g/dL   Albumin 3.7 3.5 - 5.0 g/dL   AST 24 15 - 41 U/L   ALT 16 0 - 44 U/L   Alkaline Phosphatase 52 38 - 126 U/L   Total Bilirubin 0.6 0.3 - 1.2 mg/dL   GFR, Estimated 48 (L) >60 mL/min    Comment: (NOTE) Calculated using the CKD-EPI Creatinine Equation (2021)    Anion gap 9 5 - 15    Comment: Performed at [facility], [address] [place], [place] [address]  Lipase, blood     Status: None   Collection Time: 10/07/20  7:06 PM  Result Value Ref  Range   Lipase 21 11 - 51 U/L    Comment: Performed at [facility], [address] [place], [place]  [address]  Urine Culture     Status: Abnormal (Preliminary result)   Collection Time: 10/07/20  7:06 PM   Specimen: Urine, Random  Result Value Ref Range   Specimen Description      URINE, RANDOM Performed at [facility], [address] [place], [place] [address]    Special Requests      NONE Performed at [facility], [address] [place], [place] [address]    Culture (A)     30,000 COLONIES/mL ESCHERICHIA COLI SUSCEPTIBILITIES TO FOLLOW Performed at [facility], [place] [address], [place], [place] [address]    Report Status PENDING   Urinalysis, Routine w reflex microscopic Urine, Clean Catch     Status: Abnormal   Collection Time: 10/07/20  7:06 PM  Result Value Ref Range   Color, Urine YELLOW YELLOW   APPearance CLEAR CLEAR   Specific Gravity, Urine 1.015 1.005 - 1.030   pH 6.0 5.0 - 8.0   Glucose, UA NEGATIVE NEGATIVE mg/dL   Hgb urine dipstick MODERATE (A) NEGATIVE   Bilirubin Urine NEGATIVE NEGATIVE   Ketones, ur NEGATIVE NEGATIVE mg/dL   Protein, ur NEGATIVE NEGATIVE mg/dL   Nitrite NEGATIVE NEGATIVE   Leukocytes,Ua SMALL (A) NEGATIVE    Comment: Performed at [facility], [address] [place], [place] [address]  Urinalysis, Microscopic (reflex)     Status: Abnormal   Collection Time: 10/07/20  7:06 PM  Result Value Ref Range   RBC / HPF 0-5 0 - 5 RBC/hpf   WBC, UA 0-5 0 - 5 WBC/hpf   Bacteria, UA FEW (A) NONE SEEN   Squamous Epithelial / LPF 0-5 0 - 5    Comment: Performed at [facility], [address] [place], [place] [address]  C Difficile Quick Screen w PCR reflex     Status: None   Collection Time: 10/07/20  9:48 PM   Specimen: STOOL  Result Value Ref Range   C Diff antigen NEGATIVE NEGATIVE   C Diff toxin NEGATIVE NEGATIVE   C Diff interpretation No C. difficile detected.      Comment: Performed at [facility], [place] [address] [place], [place] [address]  Resp Panel by RT-PCR (Flu A&B, Covid) Nasopharyngeal Swab     Status: None   Collection Time: 10/08/20  1:00 AM   Specimen: Nasopharyngeal Swab; Nasopharyngeal(NP) swabs in vial transport medium  Result Value Ref Range   SARS Coronavirus 2 by RT PCR NEGATIVE NEGATIVE    Comment: (NOTE) SARS-CoV-2 target nucleic acids are NOT DETECTED.  The SARS-CoV-2 RNA is generally detectable in upper respiratory specimens during the acute phase of infection. The lowest concentration of SARS-CoV-2 viral copies this assay can detect is 138 copies/mL. A negative result does not preclude SARS-Cov-2 infection and should not be used as the sole basis for treatment or other patient management decisions. A negative result may occur with  improper specimen collection/handling, submission of specimen other than nasopharyngeal swab, presence of viral mutation(s) within the areas targeted by this assay, and inadequate number of viral copies(<138 copies/mL). A negative result must be combined with clinical observations, patient history, and epidemiological information. The expected result is Negative.  Fact Sheet for Patients:  EntrepreneurPulse.com.au  Fact Sheet for Healthcare Providers:  IncredibleEmployment.be  This test is no t yet approved or cleared by the [place] FDA and  has been authorized for detection and/or diagnosis of SARS-CoV-2 by FDA  under an Emergency Use Authorization (EUA). This EUA will remain  in effect (meaning this test can be used) for the duration of the COVID-19 declaration under Section 564(b)(1) of the Act, 21 U.S.C.section 360bbb-3(b)(1), unless the authorization is terminated  or revoked sooner.       Influenza A by PCR NEGATIVE NEGATIVE   Influenza B by PCR NEGATIVE NEGATIVE    Comment: (NOTE) The Xpert Xpress SARS-CoV-2/FLU/RSV plus assay is  intended as an aid in the diagnosis of influenza from Nasopharyngeal swab specimens and should not be used as a sole basis for treatment. Nasal washings and aspirates are unacceptable for Xpert Xpress SARS-CoV-2/FLU/RSV testing.  Fact Sheet for Patients: EntrepreneurPulse.com.au  Fact Sheet for Healthcare Providers: IncredibleEmployment.be  This test is not yet approved or cleared by the [place] FDA and has been authorized for detection and/or diagnosis of SARS-CoV-2 by FDA under an Emergency Use Authorization (EUA). This EUA will remain in effect (meaning this test can be used) for the duration of the COVID-19 declaration under Section 564(b)(1) of the Act, 21 U.S.C. section 360bbb-3(b)(1), unless the authorization is terminated or revoked.  Performed at [facility], [address] [place], [place] [address]   Comprehensive metabolic panel     Status: Abnormal   Collection Time: 10/08/20 11:30 AM  Result Value Ref Range   Sodium 137 135 - 145 mmol/L   Potassium 3.9 3.5 - 5.1 mmol/L   Chloride 107 98 - 111 mmol/L   CO2 22 22 - 32 mmol/L   Glucose, Bld 114 (H) 70 - 99 mg/dL    Comment: Glucose reference range applies only to samples taken after fasting for at least 8 hours.   BUN 18 8 - 23 mg/dL   Creatinine, Ser 1.26 (H) 0.44 - 1.00 mg/dL   Calcium 8.7 (L) 8.9 - 10.3 mg/dL   Total Protein 6.5 6.5 - 8.1 g/dL   Albumin 3.5 3.5 - 5.0 g/dL   AST 23 15 - 41 U/L   ALT 16 0 - 44 U/L   Alkaline Phosphatase 54 38 - 126 U/L   Total Bilirubin 0.7 0.3 - 1.2 mg/dL   GFR, Estimated 45 (L) >60 mL/min    Comment: (NOTE) Calculated using the CKD-EPI Creatinine Equation (2021)    Anion gap 8 5 - 15    Comment: Performed at [facility], [place] [address] [place], [place] [address]  Magnesium     Status: Abnormal   Collection Time: 10/08/20 11:30 AM  Result Value Ref Range   Magnesium 1.6 (L) 1.7 - 2.4 mg/dL     Comment: Performed at [facility], [place] [address], [place], [place] [address]  Phosphorus     Status: None   Collection Time: 10/08/20 11:30 AM  Result Value Ref Range   Phosphorus 3.3 2.5 - 4.6 mg/dL    Comment: Performed at [facility], [place] [address] [place], [place] [address]  CBC WITH DIFFERENTIAL     Status: Abnormal   Collection Time: 10/08/20 11:30 AM  Result Value Ref Range   WBC 18.8 (H) 4.0 - 10.5 K/uL   RBC 3.67 (L) 3.87 - 5.11 MIL/uL   Hemoglobin 11.4 (L) 12.0 - 15.0 g/dL   HCT 34.6 (L) 36.0 - 46.0 %   MCV 94.3 80.0 - 100.0 fL   MCH 31.1 26.0 - 34.0 pg   MCHC 32.9 30.0 - 36.0 g/dL   RDW 12.5 11.5 - 15.5 %  Platelets 248 150 - 400 K/uL   nRBC 0.0 0.0 - 0.2 %   Neutrophils Relative % 61 %   Neutro Abs 16.2 (H) 1.7 - 7.7 K/uL   Band Neutrophils 25 %   Lymphocytes Relative 5 %   Lymphs Abs 0.9 0.7 - 4.0 K/uL   Monocytes Relative 2 %   Monocytes Absolute 0.4 0.1 - 1.0 K/uL   Eosinophils Relative 0 %   Eosinophils Absolute 0.0 0.0 - 0.5 K/uL   Basophils Relative 0 %   Basophils Absolute 0.0 0.0 - 0.1 K/uL   Metamyelocytes Relative 6 %   Myelocytes 1 %   Abs Immature Granulocytes 1.30 (H) 0.00 - 0.07 K/uL   Reactive, Benign Lymphocytes PRESENT     Comment: Performed at [facility], [place] [address] [place] [address], [place], [place] [address]  TSH     Status: None   Collection Time: 10/08/20 11:30 AM  Result Value Ref Range   TSH 0.601 0.350 - 4.500 uIU/mL    Comment: Performed by a 3rd Generation assay with a functional sensitivity of <=0.01 uIU/mL. Performed at [facility], [place] [address] [place], [place] [address]   Urine Culture     Status: None   Collection Time: 10/08/20  2:19 PM   Specimen: Urine, Clean Catch  Result Value Ref Range   Specimen Description      URINE, CLEAN CATCH Performed at [facility], [place] [address], [place], [place] [address]    Special Requests       NONE Performed at [facility], [place] [address], [place], [place] [address]    Culture      NO GROWTH Performed at [facility], [place] [address] [place],  [address]    Report Status 10/09/2020 FINAL   C-reactive protein     Status: Abnormal   Collection Time: 10/08/20  7:57 PM  Result Value Ref Range   CRP 23.1 (H) <1.0 mg/dL    Comment: Performed at [facility], [place] [address], [place],  [address]  Procalcitonin - Baseline     Status: None   Collection Time: 10/08/20  7:57 PM  Result Value Ref Range   Procalcitonin 2.81 ng/mL    Comment:        Interpretation: PCT > 2 ng/mL: Systemic infection (sepsis) is likely, unless other causes are known. (NOTE)       Sepsis PCT Algorithm           Lower Respiratory Tract                                      Infection PCT Algorithm    ----------------------------     ----------------------------         PCT < 0.25 ng/mL                PCT < 0.10 ng/mL          Strongly encourage             Strongly discourage   discontinuation of antibiotics    initiation of antibiotics    ----------------------------     -----------------------------       PCT 0.25 - 0.50 ng/mL            PCT 0.10 - 0.25 ng/mL  OR       >80% decrease in PCT            Discourage initiation of                                            antibiotics      Encourage discontinuation           of antibiotics    ----------------------------     -----------------------------         PCT >= 0.50 ng/mL              PCT 0.26 - 0.50 ng/mL               AND       <80% decrease in PCT              Encourage initiation of                                             antibiotics       Encourage continuation           of antibiotics    ----------------------------     -----------------------------        PCT >= 0.50 ng/mL                  PCT > 0.50 ng/mL               AND         increase in PCT                   Strongly encourage                                      initiation of antibiotics    Strongly encourage escalation           of antibiotics                                     -----------------------------                                           PCT <= 0.25 ng/mL                                                 OR                                        > 80% decrease in PCT                                      Discontinue / Do not initiate  antibiotics  Performed at [facility], [place] [address], [place], [place] [address]   Culture, blood (routine x 2)     Status: None (Preliminary result)   Collection Time: 10/08/20  8:18 PM   Specimen: BLOOD RIGHT HAND  Result Value Ref Range   Specimen Description      BLOOD RIGHT HAND Performed at [facility], [place] [address], [place], [place] [address]    Special Requests      BOTTLES DRAWN AEROBIC AND ANAEROBIC Blood Culture adequate volume Performed at [place] [address], [place], [place] [address]    Culture PENDING    Report Status PENDING   Glucose, capillary     Status: Abnormal   Collection Time: 10/08/20  9:01 PM  Result Value Ref Range   Glucose-Capillary 114 (H) 70 - 99 mg/dL    Comment: Glucose reference range applies only to samples taken after fasting for at least 8 hours.  Lactic acid, plasma     Status: None   Collection Time: 10/08/20  9:56 PM  Result Value Ref Range   Lactic Acid, Venous 0.7 0.5 - 1.9 mmol/L    Comment: Performed at [facility], [place] [address] [place], [place] [address]  CBC     Status: Abnormal   Collection Time: 10/09/20  4:46 AM  Result Value Ref Range   WBC 11.6 (H) 4.0 - 10.5 K/uL   RBC 3.80 (L) 3.87 - 5.11 MIL/uL   Hemoglobin 11.7 (L) 12.0 - 15.0 g/dL   HCT 35.3 (L) 36.0 - 46.0 %   MCV 92.9 80.0 - 100.0 fL   MCH 30.8 26.0 - 34.0 pg   MCHC 33.1 30.0 - 36.0 g/dL   RDW 12.6  11.5 - 15.5 %   Platelets 59 (L) 150 - 400 K/uL    Comment: SPECIMEN CHECKED FOR CLOTS Immature Platelet Fraction may be clinically indicated, consider ordering this additional test [id number] PLATELET COUNT CONFIRMED BY SMEAR    nRBC 0.0 0.0 - 0.2 %    Comment: Performed at [facility], [place] [address], [place], [place] [address]  Procalcitonin     Status: None   Collection Time: 10/09/20  4:46 AM  Result Value Ref Range   Procalcitonin 2.65 ng/mL    Comment:        Interpretation: PCT > 2 ng/mL: Systemic infection (sepsis) is likely, unless other causes are known. (NOTE)       Sepsis PCT Algorithm           Lower Respiratory Tract                                      Infection PCT Algorithm    ----------------------------     ----------------------------         PCT < 0.25 ng/mL                PCT < 0.10 ng/mL          Strongly encourage             Strongly discourage   discontinuation of antibiotics    initiation of antibiotics    ----------------------------     -----------------------------       PCT 0.25 - 0.50 ng/mL            PCT 0.10 - 0.25 ng/mL  OR       >80% decrease in PCT            Discourage initiation of                                            antibiotics      Encourage discontinuation           of antibiotics    ----------------------------     -----------------------------         PCT >= 0.50 ng/mL              PCT 0.26 - 0.50 ng/mL               AND       <80% decrease in PCT              Encourage initiation of                                             antibiotics       Encourage continuation           of antibiotics    ----------------------------     -----------------------------        PCT >= 0.50 ng/mL                  PCT > 0.50 ng/mL               AND         increase in PCT                  Strongly encourage                                      initiation of antibiotics    Strongly encourage escalation            of antibiotics                                     -----------------------------                                           PCT <= 0.25 ng/mL                                                 OR                                        > 80% decrease in PCT                                      Discontinue / Do not initiate  antibiotics  Performed at [facility], [place] [address] [place], [place] [address]   CBC     Status: Abnormal   Collection Time: 10/09/20  6:53 AM  Result Value Ref Range   WBC 11.2 (H) 4.0 - 10.5 K/uL   RBC 3.22 (L) 3.87 - 5.11 MIL/uL   Hemoglobin 9.9 (L) 12.0 - 15.0 g/dL   HCT 30.5 (L) 36.0 - 46.0 %   MCV 94.7 80.0 - 100.0 fL   MCH 30.7 26.0 - 34.0 pg   MCHC 32.5 30.0 - 36.0 g/dL   RDW 12.4 11.5 - 15.5 %   Platelets 201 150 - 400 K/uL    Comment: SPECIMEN CHECKED FOR CLOTS REPEATED TO VERIFY DELTA CHECK NOTED    nRBC 0.0 0.0 - 0.2 %    Comment: Performed at [facility], [place] [address], [place], [place] [address]  Comprehensive metabolic panel     Status: Abnormal   Collection Time: 10/09/20 11:20 AM  Result Value Ref Range   Sodium 134 (L) 135 - 145 mmol/L   Potassium 3.4 (L) 3.5 - 5.1 mmol/L   Chloride 106 98 - 111 mmol/L   CO2 19 (L) 22 - 32 mmol/L   Glucose, Bld 108 (H) 70 - 99 mg/dL    Comment: Glucose reference range applies only to samples taken after fasting for at least 8 hours.   BUN 16 8 - 23 mg/dL   Creatinine, Ser 1.19 (H) 0.44 - 1.00 mg/dL   Calcium 8.7 (L) 8.9 - 10.3 mg/dL   Total Protein 6.3 (L) 6.5 - 8.1 g/dL   Albumin 3.3 (L) 3.5 - 5.0 g/dL   AST 27 15 - 41 U/L   ALT 16 0 - 44 U/L   Alkaline Phosphatase 56 38 - 126 U/L   Total Bilirubin 0.5 0.3 - 1.2 mg/dL   GFR, Estimated 48 (L) >60 mL/min    Comment: (NOTE) Calculated using the CKD-EPI Creatinine Equation (2021)    Anion gap 9 5 - 15    Comment: Performed at [facility],  [place] [address] [place], [place] [address]    CT ABDOMEN PELVIS W CONTRAST  Result Date: 10/07/2020 CLINICAL DATA:  Acute abdominal pain.  Fever. EXAM: CT ABDOMEN AND PELVIS WITH CONTRAST TECHNIQUE: Multidetector CT imaging of the abdomen and pelvis was performed using the standard protocol following bolus administration of intravenous contrast. CONTRAST:  100mL OMNIPAQUE IOHEXOL 300 MG/ML  SOLN COMPARISON:  CT 04/06/2019 FINDINGS: Lower chest: No acute airspace disease. No pleural effusion. Heart is normal in size. Hepatobiliary: No focal liver abnormality is seen. No gallstones, gallbladder wall thickening, or biliary dilatation. Pancreas: Again seen fatty atrophy. No ductal dilatation or inflammation. Spleen: Normal in size without focal abnormality. Adrenals/Urinary Tract: Normal adrenal glands. Homogeneous enhancement. Symmetric excretion on delayed phase imaging. There is slight prominence of the right renal pelvis and ureter, however no evidence of obstructing lesion or stone, and excretion appears symmetric to the left. No focal renal lesion. Partially distended urinary bladder. No bladder wall thickening. Stomach/Bowel: Small hiatal hernia. The stomach is decompressed there is no small bowel obstruction. No small bowel inflammation. Edematous wall thickening of the cecum and ascending colon with pericolonic edema. Colonic wall thickening extends to the level of the proximal transverse. There is no pneumatosis. Intraluminal fluid/liquid bowel contents. Transverse colon is redundant. Colonic diverticulosis without focal diverticulitis. Diverticular changes most prominent in the sigmoid colon. Vascular/Lymphatic: Moderate aortic atherosclerosis. No aortic aneurysm. Patent portal  vein. Patent mesenteric vessels. Few prominent ileocolic and pericecal lymph nodes are likely reactive. No pelvic adenopathy. Reproductive: Hysterectomy.  No adnexal mass. Other: Trace free fluid in the pelvis is likely reactive.  There is no free air. No abdominal wall hernia. Musculoskeletal: There are no acute or suspicious osseous abnormalities. Hemangioma within L1 vertebral body. IMPRESSION: 1. Edematous wall thickening of the cecum and ascending colon with pericolonic edema, consistent with colitis, likely infectious or inflammatory. 2. Distal colonic diverticulosis without focal diverticulitis. Aortic Atherosclerosis (ICD10-I70.0). Electronically Signed   By: Keith Rake M.D.   On: 10/07/2020 20:29    Review of Systems Blood pressure (!) 124/53, pulse 61, temperature 98.4 F (36.9 C), temperature source Oral, resp. rate 15, height 5\' 2"  (1.575 m), weight 61.7 kg, SpO2 98 %. Physical Exam Constitutional:      General: She is not in acute distress.    Appearance: She is well-developed. She is not toxic-appearing.  HENT:     Head: Normocephalic and atraumatic.     Mouth/Throat:     Pharynx: Oropharynx is clear.  Eyes:     Extraocular Movements: Extraocular movements intact.     Pupils: Pupils are equal, round, and reactive to light.  Cardiovascular:     Rate and Rhythm: Normal rate and regular rhythm.     Heart sounds: Normal heart sounds.  Pulmonary:     Effort: Pulmonary effort is normal.     Breath sounds: Normal breath sounds.  Abdominal:     General: Abdomen is flat.     Palpations: Abdomen is rigid.     Tenderness: There is abdominal tenderness.     Hernia: No hernia is present.  Skin:    General: Skin is warm and dry.  Neurological:     General: No focal deficit present.     Mental Status: She is alert and oriented to person, place, and time.  Psychiatric:        Mood and Affect: Mood normal.        Behavior: Behavior normal.   Assessment/Plan: 1) Severe diarrhea with acute colitis on CT scan,nausea, vomiting- on Rocephin and Metronidazole.Seems to be improving gradually. Bentyl seems to have helped a lot. Patient does not want to have any endoscopic procedures at this time. Leukocytosis  has improved. PLEASE DO NOT GIVE THE PATIENT ANY ARTIFICIAL SWEETENERS as these cause severe osmotic diarrhea. 2) Colonic diverticulosis. 3) HTN/CKD Stage III. 4) Mild hypokalemia/mild hyponatremia. 5) Metabolic acidosis. 6) Dilutional anemia.  Juanita Craver 10/09/2020, 12:40 PM

## 2020-10-10 ENCOUNTER — Encounter (HOSPITAL_COMMUNITY)
Admission: EM | Disposition: A | Discharge status: Home / Self Care | Payer: Self-pay | Source: Home / Self Care | Attending: Internal Medicine

## 2020-10-10 LAB — BASIC METABOLIC PANEL
Anion gap: 6 (ref 5–15)
BUN: 14 mg/dL (ref 8–23)
CO2: 18 mmol/L — ABNORMAL LOW (ref 22–32)
Calcium: 8.8 mg/dL — ABNORMAL LOW (ref 8.9–10.3)
Chloride: 114 mmol/L — ABNORMAL HIGH (ref 98–111)
Creatinine, Ser: 1.06 mg/dL — ABNORMAL HIGH (ref 0.44–1.00)
GFR, Estimated: 55 mL/min — ABNORMAL LOW (ref >60)
Glucose, Bld: 103 mg/dL — ABNORMAL HIGH (ref 70–99)
Potassium: 3.7 mmol/L (ref 3.5–5.1)
Sodium: 138 mmol/L (ref 135–145)

## 2020-10-10 LAB — GASTROINTESTINAL PANEL BY PCR, STOOL (REPLACES STOOL CULTURE)

## 2020-10-10 LAB — URINE CULTURE: Culture: 30000 — AB

## 2020-10-10 SURGERY — COLONOSCOPY WITH PROPOFOL
Anesthesia: Monitor Anesthesia Care

## 2020-10-10 MED ORDER — ATORVASTATIN CALCIUM 10 MG PO TABS
20.0000 mg | ORAL_TABLET | Freq: Every day | ORAL | Status: DC
  Administered 2020-10-10 – 2020-10-11 (×2): 20 mg via ORAL
  Filled 2020-10-10 (×3): qty 2

## 2020-10-10 MED ORDER — LOPERAMIDE HCL 2 MG PO CAPS: 2.0000 mg | ORAL_CAPSULE | ORAL | Status: DC | PRN

## 2020-10-10 MED ORDER — AMLODIPINE BESYLATE 5 MG PO TABS
5.0000 mg | ORAL_TABLET | Freq: Every day | ORAL | Status: DC
  Administered 2020-10-10 – 2020-10-11 (×2): 5 mg via ORAL
  Filled 2020-10-10 (×2): qty 1

## 2020-10-10 NOTE — Progress Notes (Signed)
Subjective: No complaints.  Feeling better.  Her diarrhea stopped at 1 AM today.  Objective: Vital signs in last 24 hours: Temp:  [97.5 F (36.4 C)-98.2 F (36.8 C)] 98.2 F (36.8 C) (08/16 0510) Pulse Rate:  [55-65] 65 (08/16 0510) Resp:  [17-18] 18 (08/16 0510) BP: (116-139)/(50-56) 116/50 (08/16 0510) SpO2:  [99 %-100 %] 99 % (08/16 0510) Last BM Date: 10/09/20  Intake/Output from previous day: 08/15 0701 - 08/16 0700 In: 772 [P.O.:472; IV Piggyback:300] Out: -  Intake/Output this shift: No intake/output data recorded.  General appearance: alert and no distress GI: soft, non-tender; bowel sounds normal; no masses,  no organomegaly  Lab Results: Recent Labs    10/08/20 1130 10/09/20 0446 10/09/20 0653  WBC 18.8* 11.6* 11.2*  HGB 11.4* 11.7* 9.9*  HCT 34.6* 35.3* 30.5*  PLT 248 59* 201   BMET Recent Labs    10/08/20 1130 10/09/20 1120 10/10/20 0439  NA 137 134* 138  K 3.9 3.4* 3.7  CL 107 106 114*  CO2 22 19* 18*  GLUCOSE 114* 108* 103*  BUN 18 16 14   CREATININE 1.26* 1.19* 1.06*  CALCIUM 8.7* 8.7* 8.8*   LFT Recent Labs    10/09/20 1120  PROT 6.3*  ALBUMIN 3.3*  AST 27  ALT 16  ALKPHOS 56  BILITOT 0.5   PT/INR No results for input(s): LABPROT, INR in the last 72 hours. Hepatitis Panel No results for input(s): HEPBSAG, HCVAB, HEPAIGM, HEPBIGM in the last 72 hours. C-Diff Recent Labs    10/07/20 2148  CDIFFTOX NEGATIVE   Fecal Lactopherrin No results for input(s): FECLLACTOFRN in the last 72 hours.  Studies/Results: No results found.  Medications: Scheduled:  acidophilus  1 capsule Oral Daily   feeding supplement  1 Container Oral TID BM   heparin  5,000 Units Subcutaneous Q8H   pantoprazole  40 mg Oral Daily   sodium chloride flush  3 mL Intravenous Q12H   Continuous:  cefTRIAXone (ROCEPHIN)  IV Stopped (10/09/20 1105)   metronidazole 500 mg (10/10/20 0518)    Assessment/Plan: 1) Presumed infectious colitis. 2) Diarrhea -  resolved.   She reports good pain control and her diarrhea stopped.  The GI pathogen panel is still pending and she is C. Diff negative.  Plan: 1) Continue with antibiotics. 2) Await GI pathogen panel. 3) Signing off.  LOS: 2 days   Samantha Owen D 10/10/2020, 6:45 AM

## 2020-10-10 NOTE — Progress Notes (Signed)
PROGRESS NOTE    Samantha Owen  WJX:[id number] DOB: 1945/08/13 DOA: 10/07/2020 PCP: Lowella Dandy, NP   Chief Complaint  Patient presents with   Abdominal Pain   Brief Narrative: 75 year old female with CKD stage III, HTN, HLD, OSA, recent interim history of COVID infection 4 weeks ago presented to the ED with progressive nausea, vomiting and diarrhea x3 days prior to admission.  She had constipation last few months, felt to be from iron supplementation and was taking fiber tablets with good relief Patient was seen in the ED low-grade temp 100.2, labs with leukocytosis UA negative CT abdomen showed edematous wall thickening of the cecum and ascending colon with pericolonic edema consistent with colitis likely infectious or inflammatory, distal colonic diverticulosis Patient is admitted on empiric antibiotics C. difficile was negative  Subjective:  Had an episode of diarrhea today still watery but frequency is decreased.  No nausea or vomiting, wants to eat regular food.  Assessment & Plan:  Nausea vomiting diarrhea CT evidence of acute colitis ascending colon and cecum: C. difficile negative, GI panel negative.  Patient clinically improving continue empiric Flagyl and ceftriaxone follow-up inflammatory markers fecal calprotectin.  Encourage oral hydration, Leukocytosis is improved.  Procal is at 2.6. seen by GI and s/o. 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 Stop ivf Recent Labs    10/07/20 1906 10/08/20 1130 10/09/20 1120 10/10/20 0439  BUN 16 18 16 14   CREATININE 1.18* 1.26* 1.19* 1.06*   E. coli UTI from urine culture on 8/13 antibiotics as 1  Mild hypokalemia resolved Mild hyponatremia resolved.  Encourage p.o.   Metabolic acidosis  mild  at 18 hco3,monitor  Hypertension: Blood pressure trending up resume amlodipine 5 mg.   HLD-resume statin. OA supportive care Tylenol  Anemia slight drop in hemoglobin could be from hemodilution.  Repeat CBC in a.m. Recent Labs  Lab 10/07/20 1906 10/08/20 1130 10/09/20 0446 10/09/20 0653  HGB 11.1* 11.4* 11.7* 9.9*  HCT 32.3* 34.6* 35.3* 30.5*    Diet Order             DIET SOFT Room service appropriate? Yes; Fluid consistency: Thin  Diet effective now                  Patient's Body mass index is 24.87 kg/m. DVT prophylaxis: heparin injection 5,000 Units Start: 10/08/20 2200 Code Status:   Code Status: Full Code  Family Communication: plan of care discussed with patient and her family at bedside. Status is: Inpatient  Remains inpatient appropriate because:Ongoing diagnostic testing needed not appropriate for outpatient work up, IV treatments appropriate due to intensity of illness or inability to take PO, and Inpatient level of care appropriate due to severity of illness  Dispo: The patient is from: Home              Anticipated d/c is to: Home likely tomorrow.  Obtain PT eval              Patient currently is not medically stable to d/c.   Difficult to place patient No  Unresulted Labs (From admission, onward)     Start  Ordered   10/10/20 0263  Basic metabolic panel  Daily,   R     Question:  Specimen collection method  Answer:  Lab=Lab collect   10/09/20 1526   10/08/20 1905  Calprotectin, Fecal  Once,   R        10/08/20 1905   10/08/20 1904  Culture, blood (routine x 2)  BLOOD CULTURE X 2,   R      10/08/20 1905   10/08/20 1029  Norovirus group 1 & 2 by PCR, stool  (Norovirus group 1 & 2 by PCR, stool panel)  Once,   R        10/08/20 1030   10/08/20 1024  CBC  (heparin)  Once,   R       Comments: Baseline for heparin therapy IF NOT ALREADY DRAWN.  Notify MD if PLT < 100 K.    10/08/20 1030           Medications reviewed:  Scheduled Meds:   acidophilus  1 capsule Oral Daily   amLODipine  5 mg Oral Daily   atorvastatin  20 mg Oral Daily   feeding supplement  1 Container Oral TID BM   heparin  5,000 Units Subcutaneous Q8H   pantoprazole  40 mg Oral Daily   sodium chloride flush  3 mL Intravenous Q12H   Continuous Infusions:  cefTRIAXone (ROCEPHIN)  IV 2 g (10/10/20 1127)   metronidazole 500 mg (10/10/20 0518)   Consultants:see note  Procedures:see note Antimicrobials: Anti-infectives (From admission, onward)    Start     Dose/Rate Route Frequency Ordered Stop   10/08/20 1200  metroNIDAZOLE (FLAGYL) IVPB 500 mg        500 mg 100 mL/hr over 60 Minutes Intravenous Every 8 hours 10/08/20 1030     10/08/20 1100  cefTRIAXone (ROCEPHIN) 2 g in sodium chloride 0.9 % 100 mL IVPB        2 g 200 mL/hr over 30 Minutes Intravenous Daily 10/08/20 1030        Culture/Microbiology    Component Value Date/Time   SDES  10/08/2020 2018    BLOOD RIGHT HAND Performed at [facility], [address], [place], [place] [address]    [place]  10/08/2020 2018    BOTTLES DRAWN AEROBIC AND ANAEROBIC Blood Culture adequate volume Performed at [facility] [address], [place], [place] [address]    CULT PENDING 10/08/2020 2018   REPTSTATUS PENDING 10/08/2020 2018    Other culture-see note  Objective: Vitals: Today's Vitals   10/09/20 2013 10/09/20 2050 10/10/20 0510 10/10/20 1225  BP: (!) 123/52  (!) 116/50 (!) 162/62  Pulse: 60  65 60  Resp: 18  18 20   Temp: 97.9 F (36.6 C)  98.2 F (36.8 C) (!) 97.4 F (36.3 C)  TempSrc: Oral  Oral Oral  SpO2: 100%  99% 99%  Weight:      Height:      PainSc:  2       Intake/Output Summary (Last 24 hours) at 10/10/2020 1252 Last data filed at 10/09/2020 1553 Gross per 24 hour  Intake 536 ml  Output --  Net 536 ml   Filed Weights   10/07/20 1841  Weight: 61.7 kg   Weight change:   Intake/Output from previous day: 08/15 0701 - 08/16 0700 In: 772  [P.O.:472; IV Piggyback:300] Out: -  Intake/Output this shift: No intake/output data recorded. Filed Weights   10/07/20 1841  Weight: 61.7  kg   Examination: General exam: AAOx3, pleasant, comfortable older than stated age, weak appearing. HEENT:Oral mucosa moist, Ear/Nose WNL grossly, dentition normal. Respiratory system: bilaterally diminished, no use of accessory muscle Cardiovascular system: S1 & S2 +, No JVD,. Gastrointestinal system: Abdomen soft,NT,ND, BS+ Nervous System:Alert, awake, moving extremities and grossly nonfocal Extremities: no edema, distal peripheral pulses palpable.  Skin: No rashes,no icterus. MSK: Normal muscle bulk,tone, power  Data Reviewed: I have personally reviewed following labs and imaging studies CBC: Recent Labs  Lab 10/07/20 1906 10/08/20 1130 10/09/20 0446 10/09/20 0653  WBC 18.4* 18.8* 11.6* 11.2*  NEUTROABS 16.1* 16.2*  --   --   HGB 11.1* 11.4* 11.7* 9.9*  HCT 32.3* 34.6* 35.3* 30.5*  MCV 90.5 94.3 92.9 94.7  PLT 284 248 59* 466   Basic Metabolic Panel: Recent Labs  Lab 10/07/20 1906 10/08/20 1130 10/09/20 1120 10/10/20 0439  NA 135 137 134* 138  K 4.2 3.9 3.4* 3.7  CL 103 107 106 114*  CO2 23 22 19* 18*  GLUCOSE 121* 114* 108* 103*  BUN 16 18 16 14   CREATININE 1.18* 1.26* 1.19* 1.06*  CALCIUM 8.9 8.7* 8.7* 8.8*  MG  --  1.6*  --   --   PHOS  --  3.3  --   --    GFR: Estimated Creatinine Clearance: 39.6 mL/min (A) (by C-G formula based on SCr of 1.06 mg/dL (H)). Liver Function Tests: Recent Labs  Lab 10/07/20 1906 10/08/20 1130 10/09/20 1120  AST 24 23 27   ALT 16 16 16   ALKPHOS 52 54 56  BILITOT 0.6 0.7 0.5  PROT 6.6 6.5 6.3*  ALBUMIN 3.7 3.5 3.3*   Recent Labs  Lab 10/07/20 1906  LIPASE 21   No results for input(s): AMMONIA in the last 168 hours. Coagulation Profile: No results for input(s): INR, PROTIME in the last 168 hours. Cardiac Enzymes: No results for input(s): CKTOTAL, CKMB, CKMBINDEX, TROPONINI  in the last 168 hours. BNP (last 3 results) No results for input(s): PROBNP in the last 8760 hours. HbA1C: No results for input(s): HGBA1C in the last 72 hours. CBG: Recent Labs  Lab 10/08/20 2101  GLUCAP 114*   Lipid Profile: No results for input(s): CHOL, HDL, LDLCALC, TRIG, CHOLHDL, LDLDIRECT in the last 72 hours. Thyroid Function Tests: Recent Labs    10/08/20 1130  TSH 0.601   Anemia Panel: No results for input(s): VITAMINB12, FOLATE, FERRITIN, TIBC, IRON, RETICCTPCT in the last 72 hours. Sepsis Labs: Recent Labs  Lab 10/08/20 1957 10/08/20 2156 10/09/20 0446  PROCALCITON 2.81  --  2.65  LATICACIDVEN  --  0.7  --     Recent Results (from the past 240 hour(s))  Urine Culture     Status: Abnormal   Collection Time: 10/07/20  7:06 PM   Specimen: Urine, Random  Result Value Ref Range Status   Specimen Description   Final    URINE, RANDOM Performed at [facility], [address] [place], [place] [address]    Special Requests   Final    NONE Performed at [facility], [address] [place], [place] [address]    Culture 30,000 COLONIES/mL ESCHERICHIA COLI (A)  Final   Report Status 10/10/2020 FINAL  Final   Organism ID, Bacteria ESCHERICHIA COLI (A)  Final      Susceptibility   Escherichia coli - MIC*    AMPICILLIN <=2 SENSITIVE Sensitive     CEFAZOLIN <=4 SENSITIVE Sensitive  CEFEPIME <=0.12 SENSITIVE Sensitive     CEFTRIAXONE <=0.25 SENSITIVE Sensitive     CIPROFLOXACIN <=0.25 SENSITIVE Sensitive     GENTAMICIN <=1 SENSITIVE Sensitive     IMIPENEM <=0.25 SENSITIVE Sensitive     NITROFURANTOIN <=16 SENSITIVE Sensitive     TRIMETH/SULFA <=20 SENSITIVE Sensitive     AMPICILLIN/SULBACTAM <=2 SENSITIVE Sensitive     PIP/TAZO <=4 SENSITIVE Sensitive     * 30,000 COLONIES/mL ESCHERICHIA COLI  C Difficile Quick Screen w PCR reflex     Status: None   Collection Time: 10/07/20  9:48 PM   Specimen: STOOL  Result Value Ref Range Status    C Diff antigen NEGATIVE NEGATIVE Final   C Diff toxin NEGATIVE NEGATIVE Final   C Diff interpretation No C. difficile detected.  Final    Comment: Performed at [facility], [place] [address] [place], [place] [address]  Resp Panel by RT-PCR (Flu A&B, Covid) Nasopharyngeal Swab     Status: None   Collection Time: 10/08/20  1:00 AM   Specimen: Nasopharyngeal Swab; Nasopharyngeal(NP) swabs in vial transport medium  Result Value Ref Range Status   SARS Coronavirus 2 by RT PCR NEGATIVE NEGATIVE Final    Comment: (NOTE) SARS-CoV-2 target nucleic acids are NOT DETECTED.  The SARS-CoV-2 RNA is generally detectable in upper respiratory specimens during the acute phase of infection. The lowest concentration of SARS-CoV-2 viral copies this assay can detect is 138 copies/mL. A negative result does not preclude SARS-Cov-2 infection and should not be used as the sole basis for treatment or other patient management decisions. A negative result may occur with  improper specimen collection/handling, submission of specimen other than nasopharyngeal swab, presence of viral mutation(s) within the areas targeted by this assay, and inadequate number of viral copies(<138 copies/mL). A negative result must be combined with clinical observations, patient history, and epidemiological information. The expected result is Negative.  Fact Sheet for Patients:  EntrepreneurPulse.com.au  Fact Sheet for Healthcare Providers:  IncredibleEmployment.be  This test is no t yet approved or cleared by the [place] FDA and  has been authorized for detection and/or diagnosis of SARS-CoV-2 by FDA under an Emergency Use Authorization (EUA). This EUA will remain  in effect (meaning this test can be used) for the duration of the COVID-19 declaration under Section 564(b)(1) of the Act, 21 U.S.C.section 360bbb-3(b)(1), unless the authorization is terminated  or revoked sooner.        Influenza A by PCR NEGATIVE NEGATIVE Final   Influenza B by PCR NEGATIVE NEGATIVE Final    Comment: (NOTE) The Xpert Xpress SARS-CoV-2/FLU/RSV plus assay is intended as an aid in the diagnosis of influenza from Nasopharyngeal swab specimens and should not be used as a sole basis for treatment. Nasal washings and aspirates are unacceptable for Xpert Xpress SARS-CoV-2/FLU/RSV testing.  Fact Sheet for Patients: EntrepreneurPulse.com.au  Fact Sheet for Healthcare Providers: IncredibleEmployment.be  This test is not yet approved or cleared by the [place] FDA and has been authorized for detection and/or diagnosis of SARS-CoV-2 by FDA under an Emergency Use Authorization (EUA). This EUA will remain in effect (meaning this test can be used) for the duration of the COVID-19 declaration under Section 564(b)(1) of the Act, 21 U.S.C. section 360bbb-3(b)(1), unless the authorization is terminated or revoked.  Performed at [facility], [address] [place], [place] [address]   Urine Culture     Status: None   Collection Time: 10/08/20  2:19 PM  Specimen: Urine, Clean Catch  Result Value Ref Range Status   Specimen Description   Final    URINE, CLEAN CATCH Performed at [facility], [place] [address] [place], [place] [address]    Special Requests   Final    NONE Performed at [facility], [place] [address], [place], [place] [address]    Culture   Final    NO GROWTH Performed at [facility], [place] [address] [place], [place] [address]    Report Status 10/09/2020 FINAL  Final  Culture, blood (routine x 2)     Status: None (Preliminary result)   Collection Time: 10/08/20  8:18 PM   Specimen: BLOOD RIGHT HAND  Result Value Ref Range Status   Specimen Description   Final    BLOOD RIGHT HAND Performed at [facility], [place] [address], [place], [place] [address]    Special  Requests   Final    BOTTLES DRAWN AEROBIC AND ANAEROBIC Blood Culture adequate volume Performed at [place] [address] [place], [place] [address]    Culture PENDING  Incomplete   Report Status PENDING  Incomplete  Gastrointestinal Panel by PCR , Stool     Status: None   Collection Time: 10/09/20 12:46 PM   Specimen: Stool  Result Value Ref Range Status   Campylobacter species NOT DETECTED NOT DETECTED Final   Plesimonas shigelloides NOT DETECTED NOT DETECTED Final   Salmonella species NOT DETECTED NOT DETECTED Final   Yersinia enterocolitica NOT DETECTED NOT DETECTED Final   Vibrio species NOT DETECTED NOT DETECTED Final   Vibrio cholerae NOT DETECTED NOT DETECTED Final   Enteroaggregative E coli (EAEC) NOT DETECTED NOT DETECTED Final   Enteropathogenic E coli (EPEC) NOT DETECTED NOT DETECTED Final   Enterotoxigenic E coli (ETEC) NOT DETECTED NOT DETECTED Final   Shiga like toxin producing E coli (STEC) NOT DETECTED NOT DETECTED Final   Shigella/Enteroinvasive E coli (EIEC) NOT DETECTED NOT DETECTED Final   Cryptosporidium NOT DETECTED NOT DETECTED Final   Cyclospora cayetanensis NOT DETECTED NOT DETECTED Final   Entamoeba histolytica NOT DETECTED NOT DETECTED Final   Giardia lamblia NOT DETECTED NOT DETECTED Final   Adenovirus F40/41 NOT DETECTED NOT DETECTED Final   Astrovirus NOT DETECTED NOT DETECTED Final   Norovirus GI/GII NOT DETECTED NOT DETECTED Final   Rotavirus A NOT DETECTED NOT DETECTED Final   Sapovirus (I, II, IV, and V) NOT DETECTED NOT DETECTED Final    Comment: Performed at [facility], [address] [place], [place] [address]     Radiology Studies: No results found.   LOS: 2 days   Antonieta Pert, MD [facility]  10/10/2020, 12:52 PM

## 2020-10-11 DIAGNOSIS — R197 Diarrhea, unspecified: Secondary | ICD-10-CM

## 2020-10-11 DIAGNOSIS — R112 Nausea with vomiting, unspecified: Secondary | ICD-10-CM

## 2020-10-11 DIAGNOSIS — N390 Urinary tract infection, site not specified: Secondary | ICD-10-CM

## 2020-10-11 LAB — BASIC METABOLIC PANEL
Anion gap: 8 (ref 5–15)
BUN: 13 mg/dL (ref 8–23)
CO2: 20 mmol/L — ABNORMAL LOW (ref 22–32)
Calcium: 8.9 mg/dL (ref 8.9–10.3)
Chloride: 113 mmol/L — ABNORMAL HIGH (ref 98–111)
Creatinine, Ser: 1.06 mg/dL — ABNORMAL HIGH (ref 0.44–1.00)
GFR, Estimated: 55 mL/min — ABNORMAL LOW (ref >60)
Glucose, Bld: 95 mg/dL (ref 70–99)
Potassium: 3.6 mmol/L (ref 3.5–5.1)
Sodium: 141 mmol/L (ref 135–145)

## 2020-10-11 LAB — NOROVIRUS GROUP 1 & 2 BY PCR, STOOL
Norovirus 1 by PCR: NEGATIVE
Norovirus 2  by PCR: NEGATIVE

## 2020-10-11 MED ORDER — CIPROFLOXACIN HCL 250 MG PO TABS: 250.0000 mg | ORAL_TABLET | Freq: Two times a day (BID) | ORAL | 0 refills | Status: AC

## 2020-10-11 MED ORDER — METRONIDAZOLE 500 MG PO TABS: 500.0000 mg | ORAL_TABLET | Freq: Three times a day (TID) | ORAL | 0 refills | Status: AC

## 2020-10-11 NOTE — Discharge Summary (Addendum)
Physician Discharge Summary  Samantha Owen MHD:[id number] DOB: [birth year] DOA: 10/07/2020  PCP: Lowella Dandy, NP  Admit date: 10/07/2020 Discharge date: 10/11/2020  Time spent: 60 minutes  Recommendations for Outpatient Follow-up:  Follow-up PCP in 1 week  Discharge Diagnoses:  Active Problems:   Colitis   UTI  Discharge Condition: Stable  Diet recommendation: Heart healthy diet  Filed Weights   10/07/20 1841  Weight: 61.7 kg    History of present illness:  75 year old female with history of CKD stage III, hypertension, hyperlipidemia, OSA, recent interim history of COVID infection 4 weeks ago presented to ED with progressive nausea, vomiting and diarrhea for 3 days prior to admission.  CT scan of the abdomen/pelvis shows thickening of the cecum and ascending colon with pericolonic edema consistent with colitis likely infectious or inflammatory.  GI was consulted  Hospital Course:  Presumed infectious colitis -C. difficile PCR was negative -GI pathogen panel negative -Patient was started on empiric Flagyl and ceftriaxone -Diarrhea has resolved -GI was consulted, signed off. -We will discharge on Cipro and Flagyl for 3 more days  UTI -Urine culture from 8/13 growing E. Coli -Sensitive to ceftriaxone -We will discharge on ciprofloxacin to 50 mg p.o. twice daily for 3 more days as above  CKD stage IIIb -Renal function at baseline  Hypertension -Blood pressure stable -Continue amlodipine 5 mg daily  Anemia -Hemoglobin dropped to 9.9 from 11.7 -Likely dilutional from IV fluids -No overt signs of GI bleed -Follow CBC as outpatient    Procedures:   Gastroenterology  Discharge Exam: Vitals:   10/11/20 1010 10/11/20 1307  BP: (!) 151/56 (!) 147/59  Pulse:  (!) 52  Resp:    Temp:  (!) 97.5 F (36.4 C)  SpO2:  100%    General: Appears in no acute distress Cardiovascular: S1-S2, regular Respiratory: Clear to auscultation bilaterally  Discharge  Instructions   Discharge Instructions     Diet - low sodium heart healthy   Complete by: As directed    Increase activity slowly   Complete by: As directed       Allergies as of 10/11/2020       Reactions   Apple Other (See Comments)   Makes her feel bad    Other    Chicken,milk makes her sick   Sulfa Antibiotics         Medication List     TAKE these medications    alendronate 70 MG tablet Commonly known as: FOSAMAX Take 70 mg by mouth once a week. Take with a full glass of water on an empty stomach.   amLODipine 5 MG tablet Commonly known as: NORVASC Take 5 mg by mouth daily. What changed: Another medication with the same name was removed. Continue taking this medication, and follow the directions you see here.   atorvastatin 20 MG tablet Commonly known as: LIPITOR Take 20 mg by mouth daily.   ciprofloxacin 250 MG tablet Commonly known as: Cipro Take 1 tablet (250 mg total) by mouth 2 (two) times daily for 3 days.   gabapentin 100 MG capsule Commonly known as: NEURONTIN Take 1 tablets 3 times daily for 1 week and then take 2 tablets 3 times daily after that.   metroNIDAZOLE 500 MG tablet Commonly known as: Flagyl Take 1 tablet (500 mg total) by mouth 3 (three) times daily for 3 days.   omeprazole 40 MG capsule Commonly known as: PRILOSEC Take 40 mg by mouth daily.   PROBIOTIC PO Take 1 tablet  by mouth daily.       Allergies  Allergen Reactions   Apple Other (See Comments)    Makes her feel bad    Other     Chicken,milk makes her sick   Sulfa Antibiotics       The results of significant diagnostics from this hospitalization (including imaging, microbiology, ancillary and laboratory) are listed below for reference.    Significant Diagnostic Studies: CT ABDOMEN PELVIS W CONTRAST  Result Date: 10/07/2020 CLINICAL DATA:  Acute abdominal pain.  Fever. EXAM: CT ABDOMEN AND PELVIS WITH CONTRAST TECHNIQUE: Multidetector CT imaging of the abdomen  and pelvis was performed using the standard protocol following bolus administration of intravenous contrast. CONTRAST:  62mL OMNIPAQUE IOHEXOL 300 MG/ML  SOLN COMPARISON:  CT 04/06/2019 FINDINGS: Lower chest: No acute airspace disease. No pleural effusion. Heart is normal in size. Hepatobiliary: No focal liver abnormality is seen. No gallstones, gallbladder wall thickening, or biliary dilatation. Pancreas: Again seen fatty atrophy. No ductal dilatation or inflammation. Spleen: Normal in size without focal abnormality. Adrenals/Urinary Tract: Normal adrenal glands. Homogeneous enhancement. Symmetric excretion on delayed phase imaging. There is slight prominence of the right renal pelvis and ureter, however no evidence of obstructing lesion or stone, and excretion appears symmetric to the left. No focal renal lesion. Partially distended urinary bladder. No bladder wall thickening. Stomach/Bowel: Small hiatal hernia. The stomach is decompressed there is no small bowel obstruction. No small bowel inflammation. Edematous wall thickening of the cecum and ascending colon with pericolonic edema. Colonic wall thickening extends to the level of the proximal transverse. There is no pneumatosis. Intraluminal fluid/liquid bowel contents. Transverse colon is redundant. Colonic diverticulosis without focal diverticulitis. Diverticular changes most prominent in the sigmoid colon. Vascular/Lymphatic: Moderate aortic atherosclerosis. No aortic aneurysm. Patent portal vein. Patent mesenteric vessels. Few prominent ileocolic and pericecal lymph nodes are likely reactive. No pelvic adenopathy. Reproductive: Hysterectomy.  No adnexal mass. Other: Trace free fluid in the pelvis is likely reactive. There is no free air. No abdominal wall hernia. Musculoskeletal: There are no acute or suspicious osseous abnormalities. Hemangioma within L1 vertebral body. IMPRESSION: 1. Edematous wall thickening of the cecum and ascending colon with  pericolonic edema, consistent with colitis, likely infectious or inflammatory. 2. Distal colonic diverticulosis without focal diverticulitis. Aortic Atherosclerosis (ICD10-I70.0). Electronically Signed   By: Keith Rake M.D.   On: 10/07/2020 20:29    Microbiology: Recent Results (from the past 240 hour(s))  Urine Culture     Status: Abnormal   Collection Time: 10/07/20  7:06 PM   Specimen: Urine, Random  Result Value Ref Range Status   Specimen Description   Final    URINE, RANDOM Performed at [facility], [address] [place], [place] [address]    Special Requests   Final    NONE Performed at [facility], [address] [place], [place] [address]    Culture 30,000 COLONIES/mL ESCHERICHIA COLI (A)  Final   Report Status 10/10/2020 FINAL  Final   Organism ID, Bacteria ESCHERICHIA COLI (A)  Final      Susceptibility   Escherichia coli - MIC*    AMPICILLIN <=2 SENSITIVE Sensitive     CEFAZOLIN <=4 SENSITIVE Sensitive     CEFEPIME <=0.12 SENSITIVE Sensitive     CEFTRIAXONE <=0.25 SENSITIVE Sensitive     CIPROFLOXACIN <=0.25 SENSITIVE Sensitive     GENTAMICIN <=1 SENSITIVE Sensitive     IMIPENEM <=0.25 SENSITIVE Sensitive     NITROFURANTOIN <=16  SENSITIVE Sensitive     TRIMETH/SULFA <=20 SENSITIVE Sensitive     AMPICILLIN/SULBACTAM <=2 SENSITIVE Sensitive     PIP/TAZO <=4 SENSITIVE Sensitive     * 30,000 COLONIES/mL ESCHERICHIA COLI  C Difficile Quick Screen w PCR reflex     Status: None   Collection Time: 10/07/20  9:48 PM   Specimen: STOOL  Result Value Ref Range Status   C Diff antigen NEGATIVE NEGATIVE Final   C Diff toxin NEGATIVE NEGATIVE Final   C Diff interpretation No C. difficile detected.  Final    Comment: Performed at [facility], [place] [address] [place], [place] [address]  Resp Panel by RT-PCR (Flu A&B, Covid) Nasopharyngeal Swab     Status: None   Collection Time: 10/08/20  1:00 AM   Specimen: Nasopharyngeal Swab;  Nasopharyngeal(NP) swabs in vial transport medium  Result Value Ref Range Status   SARS Coronavirus 2 by RT PCR NEGATIVE NEGATIVE Final    Comment: (NOTE) SARS-CoV-2 target nucleic acids are NOT DETECTED.  The SARS-CoV-2 RNA is generally detectable in upper respiratory specimens during the acute phase of infection. The lowest concentration of SARS-CoV-2 viral copies this assay can detect is 138 copies/mL. A negative result does not preclude SARS-Cov-2 infection and should not be used as the sole basis for treatment or other patient management decisions. A negative result may occur with  improper specimen collection/handling, submission of specimen other than nasopharyngeal swab, presence of viral mutation(s) within the areas targeted by this assay, and inadequate number of viral copies(<138 copies/mL). A negative result must be combined with clinical observations, patient history, and epidemiological information. The expected result is Negative.  Fact Sheet for Patients:  EntrepreneurPulse.com.au  Fact Sheet for Healthcare Providers:  IncredibleEmployment.be  This test is no t yet approved or cleared by the [place] FDA and  has been authorized for detection and/or diagnosis of SARS-CoV-2 by FDA under an Emergency Use Authorization (EUA). This EUA will remain  in effect (meaning this test can be used) for the duration of the COVID-19 declaration under Section 564(b)(1) of the Act, 21 U.S.C.section 360bbb-3(b)(1), unless the authorization is terminated  or revoked sooner.       Influenza A by PCR NEGATIVE NEGATIVE Final   Influenza B by PCR NEGATIVE NEGATIVE Final    Comment: (NOTE) The Xpert Xpress SARS-CoV-2/FLU/RSV plus assay is intended as an aid in the diagnosis of influenza from Nasopharyngeal swab specimens and should not be used as a sole basis for treatment. Nasal washings and aspirates are unacceptable for Xpert Xpress  SARS-CoV-2/FLU/RSV testing.  Fact Sheet for Patients: EntrepreneurPulse.com.au  Fact Sheet for Healthcare Providers: IncredibleEmployment.be  This test is not yet approved or cleared by the [place] FDA and has been authorized for detection and/or diagnosis of SARS-CoV-2 by FDA under an Emergency Use Authorization (EUA). This EUA will remain in effect (meaning this test can be used) for the duration of the COVID-19 declaration under Section 564(b)(1) of the Act, 21 U.S.C. section 360bbb-3(b)(1), unless the authorization is terminated or revoked.  Performed at [facility], [address] [place], [place] [address]   Urine Culture     Status: None   Collection Time: 10/08/20  2:19 PM   Specimen: Urine, Clean Catch  Result Value Ref Range Status   Specimen Description   Final    URINE, CLEAN CATCH Performed at [facility], [place] [address] [place], [place] [address]    Special Requests  Final    NONE Performed at [facility], [place] [address], [place], [place] [address]    Culture   Final    NO GROWTH Performed at [facility], [place] [address], [place], [place] [address]    Report Status 10/09/2020 FINAL  Final  Culture, blood (routine x 2)     Status: None (Preliminary result)   Collection Time: 10/08/20  7:57 PM   Specimen: BLOOD  Result Value Ref Range Status   Specimen Description   Final    BLOOD BLOOD LEFT HAND Performed at [place] [address], [place], [place] [address]    Special Requests   Final    BOTTLES DRAWN AEROBIC ONLY Blood Culture adequate volume Performed at [place] [address] [place], [place] [address]    Culture   Final    NO GROWTH 2 DAYS Performed at [place] [address] [place], [place] [address]    Report Status PENDING  Incomplete  Culture, blood (routine x 2)     Status:  None (Preliminary result)   Collection Time: 10/08/20  8:18 PM   Specimen: BLOOD RIGHT HAND  Result Value Ref Range Status   Specimen Description   Final    BLOOD RIGHT HAND Performed at [facility], [place] [address], [place], [place] [address]    Special Requests   Final    BOTTLES DRAWN AEROBIC AND ANAEROBIC Blood Culture adequate volume Performed at [place] [address] [place], [place] [address]    Culture   Final    NO GROWTH 2 DAYS Performed at [place] [address] [place],  [address]    Report Status PENDING  Incomplete  Gastrointestinal Panel by PCR , Stool     Status: None   Collection Time: 10/09/20 12:46 PM   Specimen: Stool  Result Value Ref Range Status   Campylobacter species NOT DETECTED NOT DETECTED Final   Plesimonas shigelloides NOT DETECTED NOT DETECTED Final   Salmonella species NOT DETECTED NOT DETECTED Final   Yersinia enterocolitica NOT DETECTED NOT DETECTED Final   Vibrio species NOT DETECTED NOT DETECTED Final   Vibrio cholerae NOT DETECTED NOT DETECTED Final   Enteroaggregative E coli (EAEC) NOT DETECTED NOT DETECTED Final   Enteropathogenic E coli (EPEC) NOT DETECTED NOT DETECTED Final   Enterotoxigenic E coli (ETEC) NOT DETECTED NOT DETECTED Final   Shiga like toxin producing E coli (STEC) NOT DETECTED NOT DETECTED Final   Shigella/Enteroinvasive E coli (EIEC) NOT DETECTED NOT DETECTED Final   Cryptosporidium NOT DETECTED NOT DETECTED Final   Cyclospora cayetanensis NOT DETECTED NOT DETECTED Final   Entamoeba histolytica NOT DETECTED NOT DETECTED Final   Giardia lamblia NOT DETECTED NOT DETECTED Final   Adenovirus F40/41 NOT DETECTED NOT DETECTED Final   Astrovirus NOT DETECTED NOT DETECTED Final   Norovirus GI/GII NOT DETECTED NOT DETECTED Final   Rotavirus A NOT DETECTED NOT DETECTED Final   Sapovirus (I, II, IV, and V) NOT DETECTED NOT DETECTED Final    Comment: Performed at [facility], [address] [place],  [address]     Labs: Basic Metabolic Panel: Recent Labs  Lab 10/07/20 1906 10/08/20 1130 10/09/20 1120 10/10/20 0439 10/11/20 0531  NA 135 137 134* 138 141  K 4.2 3.9 3.4* 3.7 3.6  CL 103 107 106 114* 113*  CO2 23 22 19* 18* 20*  GLUCOSE 121* 114* 108* 103* 95  BUN 16 18 16 14 13   CREATININE 1.18* 1.26* 1.19* 1.06* 1.06*  CALCIUM 8.9 8.7* 8.7* 8.8* 8.9  MG  --  1.6*  --   --   --   PHOS  --  3.3  --   --   --    Liver Function Tests: Recent Labs  Lab 10/07/20 1906 10/08/20 1130 10/09/20 1120  AST 24 23 27   ALT 16 16 16   ALKPHOS 52 54 56  BILITOT 0.6 0.7 0.5  PROT 6.6 6.5 6.3*  ALBUMIN 3.7 3.5 3.3*   Recent Labs  Lab 10/07/20 1906  LIPASE 21   No results for input(s): AMMONIA in the last 168 hours. CBC: Recent Labs  Lab 10/07/20 1906 10/08/20 1130 10/09/20 0446 10/09/20 0653  WBC 18.4* 18.8* 11.6* 11.2*  NEUTROABS 16.1* 16.2*  --   --   HGB 11.1* 11.4* 11.7* 9.9*  HCT 32.3* 34.6* 35.3* 30.5*  MCV 90.5 94.3 92.9 94.7  PLT 284 248 59* 201   Cardiac Enzymes: No results for input(s): CKTOTAL, CKMB, CKMBINDEX, TROPONINI in the last 168 hours. BNP: BNP (last 3 results) No results for input(s): BNP in the last 8760 hours.  ProBNP (last 3 results) No results for input(s): PROBNP in the last 8760 hours.  CBG: Recent Labs  Lab 10/08/20 2101  GLUCAP 114*       Signed:  Oswald Hillock MD.  [facility] 10/11/2020, 1:56 PM

## 2020-10-11 NOTE — Progress Notes (Signed)
PT Cancellation Note  Patient Details Name: Samantha Owen MRN: [id number] DOB: [birth year]   Cancelled Treatment:    Reason Eval/Treat Not Completed: PT screened, no needs identified, will sign off, patient  and family report no PT needs, patientnhas been up ambulating.   Claretha Cooper 10/11/2020, 10:55 AM Tresa Endo PT Acute Rehabilitation Services Pager [phone] Office [phone]

## 2020-10-11 NOTE — Care Management Important Message (Signed)
Important Message  Patient Details IM Letter given to the Patient. Name: Samantha Owen MRN: [id number] Date of Birth: September 01, 1945   Medicare Important Message Given:  Yes     Kerin Salen 10/11/2020, 10:33 AM

## 2020-10-12 LAB — CALPROTECTIN, FECAL: Calprotectin, Fecal: 611 ug/g — ABNORMAL HIGH (ref 0–120)

## 2020-10-14 LAB — CULTURE, BLOOD (ROUTINE X 2)
Culture: NO GROWTH
Culture: NO GROWTH
Special Requests: ADEQUATE
Special Requests: ADEQUATE

## 2020-10-16 DIAGNOSIS — H52223 Regular astigmatism, bilateral: Secondary | ICD-10-CM | POA: Diagnosis not present

## 2020-10-20 DIAGNOSIS — K529 Noninfective gastroenteritis and colitis, unspecified: Secondary | ICD-10-CM | POA: Diagnosis not present

## 2020-10-20 DIAGNOSIS — Z79899 Other long term (current) drug therapy: Secondary | ICD-10-CM | POA: Diagnosis not present

## 2020-10-20 DIAGNOSIS — D539 Nutritional anemia, unspecified: Secondary | ICD-10-CM | POA: Diagnosis not present

## 2020-10-20 DIAGNOSIS — A499 Bacterial infection, unspecified: Secondary | ICD-10-CM | POA: Diagnosis not present

## 2020-10-20 DIAGNOSIS — I10 Essential (primary) hypertension: Secondary | ICD-10-CM | POA: Diagnosis not present

## 2020-10-20 DIAGNOSIS — N390 Urinary tract infection, site not specified: Secondary | ICD-10-CM | POA: Diagnosis not present

## 2020-10-20 DIAGNOSIS — Z6825 Body mass index (BMI) 25.0-25.9, adult: Secondary | ICD-10-CM | POA: Diagnosis not present

## 2020-10-26 DIAGNOSIS — M7661 Achilles tendinitis, right leg: Secondary | ICD-10-CM | POA: Diagnosis not present

## 2020-10-26 DIAGNOSIS — K529 Noninfective gastroenteritis and colitis, unspecified: Secondary | ICD-10-CM | POA: Diagnosis not present

## 2020-10-26 DIAGNOSIS — K649 Unspecified hemorrhoids: Secondary | ICD-10-CM | POA: Diagnosis not present

## 2020-10-26 DIAGNOSIS — K5790 Diverticulosis of intestine, part unspecified, without perforation or abscess without bleeding: Secondary | ICD-10-CM | POA: Diagnosis not present

## 2020-10-26 DIAGNOSIS — K219 Gastro-esophageal reflux disease without esophagitis: Secondary | ICD-10-CM | POA: Diagnosis not present

## 2020-10-31 DIAGNOSIS — D1801 Hemangioma of skin and subcutaneous tissue: Secondary | ICD-10-CM | POA: Diagnosis not present

## 2020-10-31 DIAGNOSIS — L821 Other seborrheic keratosis: Secondary | ICD-10-CM | POA: Diagnosis not present

## 2020-10-31 DIAGNOSIS — D225 Melanocytic nevi of trunk: Secondary | ICD-10-CM | POA: Diagnosis not present

## 2020-10-31 DIAGNOSIS — L814 Other melanin hyperpigmentation: Secondary | ICD-10-CM | POA: Diagnosis not present

## 2020-10-31 DIAGNOSIS — L570 Actinic keratosis: Secondary | ICD-10-CM | POA: Diagnosis not present

## 2020-11-07 DIAGNOSIS — I10 Essential (primary) hypertension: Secondary | ICD-10-CM | POA: Diagnosis not present

## 2020-11-07 DIAGNOSIS — D539 Nutritional anemia, unspecified: Secondary | ICD-10-CM | POA: Diagnosis not present

## 2020-11-07 DIAGNOSIS — I700 Atherosclerosis of aorta: Secondary | ICD-10-CM | POA: Diagnosis not present

## 2020-11-07 DIAGNOSIS — N3090 Cystitis, unspecified without hematuria: Secondary | ICD-10-CM | POA: Diagnosis not present

## 2020-11-07 DIAGNOSIS — K529 Noninfective gastroenteritis and colitis, unspecified: Secondary | ICD-10-CM | POA: Diagnosis not present

## 2020-11-07 DIAGNOSIS — Z6825 Body mass index (BMI) 25.0-25.9, adult: Secondary | ICD-10-CM | POA: Diagnosis not present

## 2020-11-28 DIAGNOSIS — R300 Dysuria: Secondary | ICD-10-CM | POA: Diagnosis not present

## 2020-11-28 DIAGNOSIS — I10 Essential (primary) hypertension: Secondary | ICD-10-CM | POA: Diagnosis not present

## 2020-11-28 DIAGNOSIS — R3911 Hesitancy of micturition: Secondary | ICD-10-CM | POA: Diagnosis not present

## 2020-12-12 DIAGNOSIS — Z79899 Other long term (current) drug therapy: Secondary | ICD-10-CM | POA: Diagnosis not present

## 2020-12-12 DIAGNOSIS — N393 Stress incontinence (female) (male): Secondary | ICD-10-CM | POA: Diagnosis not present

## 2020-12-12 DIAGNOSIS — R3911 Hesitancy of micturition: Secondary | ICD-10-CM | POA: Diagnosis not present

## 2020-12-12 DIAGNOSIS — R300 Dysuria: Secondary | ICD-10-CM | POA: Diagnosis not present

## 2020-12-12 DIAGNOSIS — N390 Urinary tract infection, site not specified: Secondary | ICD-10-CM | POA: Diagnosis not present

## 2020-12-12 DIAGNOSIS — N952 Postmenopausal atrophic vaginitis: Secondary | ICD-10-CM | POA: Diagnosis not present

## 2021-01-24 DIAGNOSIS — N393 Stress incontinence (female) (male): Secondary | ICD-10-CM | POA: Diagnosis not present

## 2021-01-24 DIAGNOSIS — R3911 Hesitancy of micturition: Secondary | ICD-10-CM | POA: Diagnosis not present

## 2021-01-24 DIAGNOSIS — N390 Urinary tract infection, site not specified: Secondary | ICD-10-CM | POA: Diagnosis not present

## 2021-01-24 DIAGNOSIS — N952 Postmenopausal atrophic vaginitis: Secondary | ICD-10-CM | POA: Diagnosis not present

## 2021-01-24 DIAGNOSIS — R300 Dysuria: Secondary | ICD-10-CM | POA: Diagnosis not present

## 2021-01-30 DIAGNOSIS — J019 Acute sinusitis, unspecified: Secondary | ICD-10-CM | POA: Diagnosis not present

## 2021-01-30 DIAGNOSIS — J309 Allergic rhinitis, unspecified: Secondary | ICD-10-CM | POA: Diagnosis not present

## 2021-02-08 DIAGNOSIS — N390 Urinary tract infection, site not specified: Secondary | ICD-10-CM | POA: Diagnosis not present

## 2021-03-01 DIAGNOSIS — R519 Headache, unspecified: Secondary | ICD-10-CM | POA: Diagnosis not present

## 2021-03-01 DIAGNOSIS — D539 Nutritional anemia, unspecified: Secondary | ICD-10-CM | POA: Diagnosis not present

## 2021-03-01 DIAGNOSIS — Z79899 Other long term (current) drug therapy: Secondary | ICD-10-CM | POA: Diagnosis not present

## 2021-03-01 DIAGNOSIS — I10 Essential (primary) hypertension: Secondary | ICD-10-CM | POA: Diagnosis not present

## 2021-03-01 DIAGNOSIS — N289 Disorder of kidney and ureter, unspecified: Secondary | ICD-10-CM | POA: Diagnosis not present

## 2021-03-01 DIAGNOSIS — E559 Vitamin D deficiency, unspecified: Secondary | ICD-10-CM | POA: Diagnosis not present

## 2021-03-01 DIAGNOSIS — Z6823 Body mass index (BMI) 23.0-23.9, adult: Secondary | ICD-10-CM | POA: Diagnosis not present

## 2021-03-01 DIAGNOSIS — G8929 Other chronic pain: Secondary | ICD-10-CM | POA: Diagnosis not present

## 2021-03-01 DIAGNOSIS — N390 Urinary tract infection, site not specified: Secondary | ICD-10-CM | POA: Diagnosis not present

## 2021-03-01 DIAGNOSIS — E785 Hyperlipidemia, unspecified: Secondary | ICD-10-CM | POA: Diagnosis not present

## 2021-03-30 DIAGNOSIS — N390 Urinary tract infection, site not specified: Secondary | ICD-10-CM | POA: Diagnosis not present

## 2021-04-28 DIAGNOSIS — R5383 Other fatigue: Secondary | ICD-10-CM | POA: Diagnosis not present

## 2021-04-28 DIAGNOSIS — R8281 Pyuria: Secondary | ICD-10-CM | POA: Diagnosis not present

## 2021-05-01 DIAGNOSIS — L300 Nummular dermatitis: Secondary | ICD-10-CM | POA: Diagnosis not present

## 2021-05-01 DIAGNOSIS — L570 Actinic keratosis: Secondary | ICD-10-CM | POA: Diagnosis not present

## 2021-05-08 DIAGNOSIS — R7989 Other specified abnormal findings of blood chemistry: Secondary | ICD-10-CM | POA: Diagnosis not present

## 2021-05-08 DIAGNOSIS — N289 Disorder of kidney and ureter, unspecified: Secondary | ICD-10-CM | POA: Diagnosis not present

## 2021-05-08 DIAGNOSIS — R5383 Other fatigue: Secondary | ICD-10-CM | POA: Diagnosis not present

## 2021-05-08 DIAGNOSIS — A499 Bacterial infection, unspecified: Secondary | ICD-10-CM | POA: Diagnosis not present

## 2021-05-08 DIAGNOSIS — N390 Urinary tract infection, site not specified: Secondary | ICD-10-CM | POA: Diagnosis not present

## 2021-05-08 DIAGNOSIS — D539 Nutritional anemia, unspecified: Secondary | ICD-10-CM | POA: Diagnosis not present

## 2021-05-08 DIAGNOSIS — R5381 Other malaise: Secondary | ICD-10-CM | POA: Diagnosis not present

## 2021-05-08 DIAGNOSIS — R197 Diarrhea, unspecified: Secondary | ICD-10-CM | POA: Diagnosis not present

## 2021-05-09 DIAGNOSIS — R197 Diarrhea, unspecified: Secondary | ICD-10-CM | POA: Diagnosis not present

## 2021-05-31 DIAGNOSIS — N390 Urinary tract infection, site not specified: Secondary | ICD-10-CM | POA: Diagnosis not present

## 2021-06-27 DIAGNOSIS — R3911 Hesitancy of micturition: Secondary | ICD-10-CM | POA: Diagnosis not present

## 2021-06-27 DIAGNOSIS — N952 Postmenopausal atrophic vaginitis: Secondary | ICD-10-CM | POA: Diagnosis not present

## 2021-06-27 DIAGNOSIS — N393 Stress incontinence (female) (male): Secondary | ICD-10-CM | POA: Diagnosis not present

## 2021-06-27 DIAGNOSIS — R300 Dysuria: Secondary | ICD-10-CM | POA: Diagnosis not present

## 2021-06-27 DIAGNOSIS — N390 Urinary tract infection, site not specified: Secondary | ICD-10-CM | POA: Diagnosis not present

## 2021-08-25 DEATH — deceased
